# Patient Record
Sex: Male | Born: 1975 | Race: White | Hispanic: No | Marital: Single | Smoking: Current every day smoker
Health system: Southern US, Community
[De-identification: ages and names within clinical notes are randomized; demographics above are authoritative.]

## PROBLEM LIST (undated history)

## (undated) DIAGNOSIS — E7800 Pure hypercholesterolemia, unspecified: Secondary | ICD-10-CM

## (undated) DIAGNOSIS — Z8619 Personal history of other infectious and parasitic diseases: Secondary | ICD-10-CM

## (undated) DIAGNOSIS — R238 Other skin changes: Secondary | ICD-10-CM

## (undated) DIAGNOSIS — N4830 Priapism, unspecified: Secondary | ICD-10-CM

## (undated) DIAGNOSIS — F1910 Other psychoactive substance abuse, uncomplicated: Secondary | ICD-10-CM

## (undated) DIAGNOSIS — G4733 Obstructive sleep apnea (adult) (pediatric): Secondary | ICD-10-CM

## (undated) DIAGNOSIS — B20 Human immunodeficiency virus [HIV] disease: Secondary | ICD-10-CM

## (undated) DIAGNOSIS — Z8739 Personal history of other diseases of the musculoskeletal system and connective tissue: Secondary | ICD-10-CM

## (undated) DIAGNOSIS — R7401 Elevation of levels of liver transaminase levels: Secondary | ICD-10-CM

## (undated) DIAGNOSIS — E119 Type 2 diabetes mellitus without complications: Secondary | ICD-10-CM

## (undated) HISTORY — DX: Obstructive sleep apnea (adult) (pediatric): G47.33

## (undated) HISTORY — DX: Type 2 diabetes mellitus without complications: E11.9

## (undated) HISTORY — DX: Priapism, unspecified: N48.30

## (undated) HISTORY — DX: Pure hypercholesterolemia, unspecified: E78.00

## (undated) HISTORY — DX: Personal history of other infectious and parasitic diseases: Z86.19

## (undated) HISTORY — DX: Personal history of other diseases of the musculoskeletal system and connective tissue: Z87.39

## (undated) HISTORY — DX: Morbid (severe) obesity due to excess calories: E66.01

## (undated) HISTORY — DX: Human immunodeficiency virus (HIV) disease: B20

## (undated) HISTORY — DX: Elevation of levels of liver transaminase levels: R74.01

## (undated) HISTORY — DX: Other psychoactive substance abuse, uncomplicated: F19.10

## (undated) MED FILL — Medication: Fill #1 | Status: CN

## (undated) MED FILL — Medication: Fill #2 | Status: CN

---
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
## 2009-03-26 DIAGNOSIS — B20 Human immunodeficiency virus [HIV] disease: Secondary | ICD-10-CM

## 2009-03-26 DIAGNOSIS — Z21 Asymptomatic human immunodeficiency virus [HIV] infection status: Secondary | ICD-10-CM

## 2009-03-26 HISTORY — DX: Asymptomatic human immunodeficiency virus (hiv) infection status: Z21

## 2009-03-26 HISTORY — DX: Human immunodeficiency virus (HIV) disease: B20

## 2009-10-08 ENCOUNTER — Encounter: Payer: Self-pay | Admitting: Infectious Diseases

## 2009-10-19 ENCOUNTER — Ambulatory Visit: Payer: Self-pay | Admitting: Internal Medicine

## 2009-10-19 DIAGNOSIS — B20 Human immunodeficiency virus [HIV] disease: Secondary | ICD-10-CM

## 2009-10-19 LAB — CONVERTED CEMR LAB
ALT: 24 units/L (ref 0–53)
AST: 17 units/L (ref 0–37)
Calcium: 9.2 mg/dL (ref 8.4–10.5)
Chloride: 102 meq/L (ref 96–112)
Creatinine, Ser: 1.1 mg/dL (ref 0.40–1.50)
Eosinophils Absolute: 0.1 10*3/uL (ref 0.0–0.7)
GC Probe Amp, Urine: NEGATIVE
HCV Ab: NEGATIVE
HIV 1 RNA Quant: 494 copies/mL — ABNORMAL HIGH (ref <48)
HIV-1 RNA Quant, Log: 2.69 — ABNORMAL HIGH (ref <1.68)
Hep B Core Total Ab: NEGATIVE
Hepatitis B Surface Ag: NEGATIVE
Leukocytes, UA: NEGATIVE
Lymphocytes Relative: 35 % (ref 12–46)
Lymphs Abs: 2 10*3/uL (ref 0.7–4.0)
Neutrophils Relative %: 56 % (ref 43–77)
Nitrite: NEGATIVE
Platelets: 187 10*3/uL (ref 150–400)
Specific Gravity, Urine: 1.018 (ref 1.005–1.030)
Total CHOL/HDL Ratio: 5.6
WBC: 5.7 10*3/uL (ref 4.0–10.5)
pH: 7 (ref 5.0–8.0)

## 2009-11-05 ENCOUNTER — Encounter: Payer: Self-pay | Admitting: Internal Medicine

## 2009-11-09 ENCOUNTER — Ambulatory Visit: Payer: Self-pay | Admitting: Internal Medicine

## 2009-11-09 ENCOUNTER — Encounter (INDEPENDENT_AMBULATORY_CARE_PROVIDER_SITE_OTHER): Payer: Self-pay | Admitting: *Deleted

## 2009-11-11 ENCOUNTER — Encounter: Payer: Self-pay | Admitting: Internal Medicine

## 2010-01-19 ENCOUNTER — Encounter: Payer: Self-pay | Admitting: Internal Medicine

## 2010-01-19 ENCOUNTER — Ambulatory Visit: Payer: Self-pay | Admitting: Internal Medicine

## 2010-01-19 LAB — CONVERTED CEMR LAB
ALT: 31 units/L (ref 0–53)
AST: 19 units/L (ref 0–37)
Albumin: 4.2 g/dL (ref 3.5–5.2)
Alkaline Phosphatase: 76 units/L (ref 39–117)
BUN: 16 mg/dL (ref 6–23)
Basophils Absolute: 0 10*3/uL (ref 0.0–0.1)
Basophils Relative: 0 % (ref 0–1)
CO2: 24 meq/L (ref 19–32)
Calcium: 8.9 mg/dL (ref 8.4–10.5)
Chloride: 106 meq/L (ref 96–112)
Creatinine, Ser: 0.98 mg/dL (ref 0.40–1.50)
Eosinophils Absolute: 0.2 10*3/uL (ref 0.0–0.7)
Eosinophils Relative: 3 % (ref 0–5)
Glucose, Bld: 124 mg/dL — ABNORMAL HIGH (ref 70–99)
HCT: 46.8 % (ref 39.0–52.0)
HIV 1 RNA Quant: 631 copies/mL — ABNORMAL HIGH (ref <48)
HIV-1 RNA Quant, Log: 2.8 — ABNORMAL HIGH (ref <1.68)
Hemoglobin: 15.4 g/dL (ref 13.0–17.0)
Lymphocytes Relative: 32 % (ref 12–46)
Lymphs Abs: 1.5 10*3/uL (ref 0.7–4.0)
MCHC: 32.9 g/dL (ref 30.0–36.0)
MCV: 94.5 fL (ref 78.0–100.0)
Monocytes Absolute: 0.3 10*3/uL (ref 0.1–1.0)
Monocytes Relative: 5 % (ref 3–12)
Neutro Abs: 2.7 10*3/uL (ref 1.7–7.7)
Neutrophils Relative %: 59 % (ref 43–77)
Platelets: 172 10*3/uL (ref 150–400)
Potassium: 3.9 meq/L (ref 3.5–5.3)
RBC: 4.95 M/uL (ref 4.22–5.81)
RDW: 13.3 % (ref 11.5–15.5)
Sodium: 141 meq/L (ref 135–145)
Total Bilirubin: 0.7 mg/dL (ref 0.3–1.2)
Total Protein: 6.8 g/dL (ref 6.0–8.3)
WBC: 4.7 10*3/uL (ref 4.0–10.5)

## 2010-02-16 ENCOUNTER — Ambulatory Visit: Payer: Self-pay | Admitting: Infectious Disease

## 2010-02-16 DIAGNOSIS — R519 Headache, unspecified: Secondary | ICD-10-CM | POA: Insufficient documentation

## 2010-02-16 DIAGNOSIS — R51 Headache: Secondary | ICD-10-CM

## 2010-02-16 DIAGNOSIS — L259 Unspecified contact dermatitis, unspecified cause: Secondary | ICD-10-CM | POA: Insufficient documentation

## 2010-03-03 ENCOUNTER — Ambulatory Visit: Payer: Self-pay | Admitting: Internal Medicine

## 2010-03-03 LAB — CONVERTED CEMR LAB
AST: 19 units/L (ref 0–37)
Albumin: 4.1 g/dL (ref 3.5–5.2)
Alkaline Phosphatase: 76 units/L (ref 39–117)
BUN: 12 mg/dL (ref 6–23)
Bilirubin Urine: NEGATIVE
HDL: 36 mg/dL — ABNORMAL LOW (ref >39)
HIV 1 RNA Quant: 1910 copies/mL — ABNORMAL HIGH (ref <48)
Ketones, urine, test strip: NEGATIVE
LDL Cholesterol: 106 mg/dL — ABNORMAL HIGH (ref 0–99)
Nitrite: NEGATIVE
Potassium: 5.1 meq/L (ref 3.5–5.3)
Specific Gravity, Urine: >=1.03
Total Bilirubin: 0.5 mg/dL (ref 0.3–1.2)
Total CHOL/HDL Ratio: 4.6
Triglycerides: 108 mg/dL (ref <150)
VLDL: 22 mg/dL (ref 0–40)

## 2010-03-18 ENCOUNTER — Telehealth (INDEPENDENT_AMBULATORY_CARE_PROVIDER_SITE_OTHER): Payer: Self-pay | Admitting: *Deleted

## 2010-04-05 ENCOUNTER — Encounter: Payer: Self-pay | Admitting: Internal Medicine

## 2010-04-05 ENCOUNTER — Ambulatory Visit: Payer: Self-pay | Admitting: Internal Medicine

## 2010-04-05 LAB — CONVERTED CEMR LAB: CD4 Count: 619 microliters

## 2010-04-08 ENCOUNTER — Encounter: Payer: Self-pay | Admitting: Internal Medicine

## 2010-04-11 ENCOUNTER — Encounter: Payer: Self-pay | Admitting: Infectious Disease

## 2010-04-12 ENCOUNTER — Ambulatory Visit: Payer: Self-pay | Admitting: Internal Medicine

## 2010-05-03 ENCOUNTER — Telehealth: Payer: Self-pay | Admitting: Infectious Disease

## 2010-05-04 ENCOUNTER — Encounter: Payer: Self-pay | Admitting: Infectious Disease

## 2010-05-09 ENCOUNTER — Encounter: Payer: Self-pay | Admitting: Internal Medicine

## 2010-05-09 ENCOUNTER — Ambulatory Visit: Payer: Self-pay | Admitting: Internal Medicine

## 2010-05-09 LAB — CONVERTED CEMR LAB
BUN: 14 mg/dL (ref 6–23)
Blood in Urine, dipstick: NEGATIVE
CO2: 26 meq/L (ref 19–32)
Chloride: 106 meq/L (ref 96–112)
Creatinine, Ser: 1.03 mg/dL (ref 0.40–1.50)
Glucose, Bld: 102 mg/dL — ABNORMAL HIGH (ref 70–99)
HIV-1 RNA Quant, Log: <1.3 (ref <1.30)
Ketones, urine, test strip: NEGATIVE
Nitrite: NEGATIVE
Potassium: 4.6 meq/L (ref 3.5–5.3)
Protein, U semiquant: NEGATIVE
Urobilinogen, UA: 0.2
WBC Urine, dipstick: NEGATIVE

## 2010-05-23 ENCOUNTER — Ambulatory Visit: Payer: Self-pay | Admitting: Internal Medicine

## 2010-05-23 DIAGNOSIS — H6690 Otitis media, unspecified, unspecified ear: Secondary | ICD-10-CM | POA: Insufficient documentation

## 2010-06-14 ENCOUNTER — Telehealth: Payer: Self-pay | Admitting: Internal Medicine

## 2010-07-19 ENCOUNTER — Ambulatory Visit
Admission: RE | Admit: 2010-07-19 | Discharge: 2010-07-19 | Payer: Self-pay | Source: Home / Self Care | Attending: Internal Medicine | Admitting: Internal Medicine

## 2010-07-19 ENCOUNTER — Encounter: Payer: Self-pay | Admitting: Infectious Disease

## 2010-07-19 LAB — CONVERTED CEMR LAB
BUN: 10 mg/dL (ref 6–23)
Blood in Urine, dipstick: NEGATIVE
CD4 Count: 547 microliters
Chloride: 105 meq/L (ref 96–112)
Glucose, Bld: 101 mg/dL — ABNORMAL HIGH (ref 70–99)
Glucose, Urine, Semiquant: NEGATIVE
HIV 1 RNA Quant: <20 copies/mL (ref <20)
HIV-1 RNA Quant, Log: <1.3 (ref <1.30)
Nitrite: NEGATIVE
Potassium: 4.2 meq/L (ref 3.5–5.3)
Sodium: 140 meq/L (ref 135–145)
pH: 5.5

## 2010-07-26 NOTE — Assessment & Plan Note (Signed)
Summary: RESEARCH [MKJ]    Current Allergies: No known allergies  Vital Signs:  Patient profile:   35 year old male Height:      71.5 inches (181.61 cm) Weight:      247.75 pounds (112.61 kg) BMI:     34.20 Temp:     98.2 degrees F oral Pulse rate:   75 / minute Resp:     16 per minute BP sitting:   102 / 67  (left arm) Is Patient Diabetic? No Research Study Name: START Pain Assessment Patient in pain? no      Nutritional Status BMI of > 30 = obese  Does patient need assistance? Functional Status Self care Ambulation Normal   Patient here to screen for START study. Informed consent obtained per SOP guidelines. Patient had read over consent prior to visit. We discussed it and all questions were answered. He also consented to the neurology and genomics substudies. He denies any current complaints, except for a fading rash on his trunk that has responded to the triamcinolone cream. Neuro tests were performed at this visit. Peg grooved board results were Rt (dominant)hand - 77sec. and Lt hand - 74sec. Other results documented on worksheets. ECG will be done at the next CD4 blood draw visit. He plans to return 03/24/10 for the next screening visit. Deirdre Evener RN  March 03, 2010 1:36 PM   Other Orders: Est. Patient Research Study [id number]) T-Comprehensive Metabolic Panel [id number]) T-Lipid Profile [phone]) T-Urinalysis Dipstick only [phone]) T-HIV Viral Load [phone])  Laboratory Results   Urine Tests  Date/Time Recieved: 03/03/10 11am Date/Time Reported: 03/03/10 11am  Routine Urinalysis   Color: yellow Appearance: Clear Glucose: negative   (Normal Range: Negative) Bilirubin: negative   (Normal Range: Negative) Ketone: negative   (Normal Range: Negative) Spec. Gravity: >=1.030   (Normal Range: 1.003-1.035) Blood: negative   (Normal Range: Negative) pH: 5.5   (Normal Range: 5.0-8.0) Protein: negative   (Normal Range: Negative) Urobilinogen: 0.2   (Normal  Range: 0-1) Nitrite: negative   (Normal Range: Negative) Leukocyte Esterace: negative   (Normal Range: Negative)

## 2010-07-26 NOTE — Miscellaneous (Signed)
Summary: CD4 (RESEARCH)  Clinical Lists Changes  Observations: Added new observation of CD4 COUNT: 699 microliters (05/09/2010 14:15)

## 2010-07-26 NOTE — Miscellaneous (Signed)
Summary: CD4 (RESEARCH)  Clinical Lists Changes  Observations: Added new observation of CD4 COUNT: 619 microliters (04/05/2010 9:45)

## 2010-07-26 NOTE — Miscellaneous (Signed)
Summary: CD4 (RESEARCH)  Clinical Lists Changes  Observations: Added new observation of CD4 COUNT: 598 microliters (03/03/2010 9:44)

## 2010-07-26 NOTE — Miscellaneous (Signed)
  Clinical Lists Changes       PPD Results    Date of reading: 04/08/2010    Results: < 5mm    Interpretation: negative

## 2010-07-26 NOTE — Miscellaneous (Signed)
  Clinical Lists Changes  Medications: Removed medication of ATRIPLA 600-200-300 MG TABS (EFAVIRENZ-EMTRICITAB-TENOFOVIR) 1 by mouth daily on an empty stomach Added new medication of ATRIPLA 600-200-300 MG TABS (EFAVIRENZ-EMTRICITAB-TENOFOVIR) 1 by mouth daily on an empty stomach - Signed Rx of ATRIPLA 600-200-300 MG TABS (EFAVIRENZ-EMTRICITAB-TENOFOVIR) 1 by mouth daily on an empty stomach;  #90 x 0;  Signed;  Entered by: Deirdre Evener RN;  Authorized by: Paulette Blanch Dam MD;  Method used: Print then Give to Patient    Prescriptions: ATRIPLA 600-200-300 MG TABS (EFAVIRENZ-EMTRICITAB-TENOFOVIR) 1 by mouth daily on an empty stomach  #90 x 0   Entered by:   Deirdre Evener RN   Authorized by:   Acey Lav MD   Signed by:   Deirdre Evener RN on 05/04/2010   Method used:   Print then Give to Patient   RxID:   [id number]

## 2010-07-26 NOTE — Assessment & Plan Note (Signed)
Summary: f/u on labs KV pt/jc   Visit Type:  Follow-up Primary Provider:  Yisroel Ramming  CC:  follow-up visit.  History of Present Illness: 35 year old with HIV diagnosed in [place] with vL in 3k range, wildtype virus, who has had CD4 count consistently above 500 since then and actually relatively low viral load between 400 to 600 copies. He returns for followup visit. He has been doing well though he does have:  #1 headache behind sinuses and left eye that occursw in am at work and does respond to [employer] and plugging ears and rest.  #2 rash on his trunk that looks on exam like eczema that respondes to topical neosporin  I spent an hour with this patient including >50% face to face counselling. We went over DHHS first line therapies and new complera as well> We discussed START trial for which he would be a candidate and plusses and minuses of starting now vs waiting inside or outside of a trial. He expressed interest in START and I had Windell Moulding meet with him and will refer him to [facility].  Problems Prior to Update: 1)  HIV Infection  (ICD-042)  Medications Prior to Update: 1)  None  Current Medications (verified): 1)  Triamcinolone Acetonide 0.025 % Oint (Triamcinolone Acetonide) .... Apply Twice Daliy To Rash  Allergies (verified): No Known Drug Allergies   Preventive Screening-Counseling & Management  Alcohol-Tobacco     Alcohol drinks/day: weekends 7 drinks per day     Alcohol type: beer     Smoking Status: current     Packs/Day: 0.75     Year Started: 1990  Caffeine-Diet-Exercise     Caffeine use/day: tea, soda 6 per day     Does Patient Exercise: yes     Type of exercise: running     Times/week: <3  Safety-Violence-Falls     Seat Belt Use: yes   Current Allergies (reviewed today): No known allergies  Past History:  Past Medical History: HIV Eczema Headaches  Past Surgical History: None  Family History: nonconytributory  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  The patient denies anorexia, fever, weight loss, weight gain, vision loss, decreased hearing, hoarseness, chest pain, syncope, dyspnea on exertion, peripheral edema, prolonged cough, hemoptysis, abdominal pain, melena, hematochezia, severe indigestion/heartburn, hematuria, incontinence, genital sores, muscle weakness, transient blindness, difficulty walking, depression, unusual weight change, abnormal bleeding, enlarged lymph nodes, and angioedema.    Vital Signs:  Patient profile:   35 year old male Height:      71 inches (180.34 cm) Weight:      248.4 pounds (112.91 kg) BMI:     34.77 Temp:     98.2 degrees F oral Pulse rate:   91 / minute BP sitting:   109 / 73  (left arm)  Vitals Entered By: Baxter Hire) (February 16, 2010 3:15 PM) CC: follow-up visit Pain Assessment Patient in pain? no      Nutritional Status BMI of > 30 = obese Nutritional Status Detail appetite is healthy per patient  Have you ever been in a relationship where you felt threatened, hurt or afraid?No   Does patient need assistance? Functional Status Self care Ambulation Normal   Physical Exam  General:  alert, well-developed, well-nourished, and well-hydrated.   Head:  normocephalic and atraumatic.   Eyes:  pupils equal, pupils round, pupils  reactive to light, and pupils react to accomodation.   Ears:  no external deformities.   Nose:  no external erythema.   Mouth:  pharynx pink and moist, no erythema, and no exudates.   Neck:  supple and full ROM.   Lungs:  normal breath sounds.  no crackles and no wheezes.   Heart:  normal rate, regular rhythm, and no murmur.   Abdomen:  soft and no distention.   Msk:  normal ROM and no joint deformities.   Extremities:  No clubbing, cyanosis, edema, or deformity noted with normal full range of motion of all  joints.   Neurologic:  alert & oriented X3, strength normal in all extremities, and gait normal.   Skin:  he has ezcematous rash on trunk few cmin diameter Psych:  Oriented X3, memory intact for recent and remote, and normally interactive.      Impression & Recommendations:  Problem # 1:  HIV INFECTION (ICD-042) I think he could be an ideal candidate for START. He has relatively low viral load so if he is randomized to deferred therapy, my guess is his cd4 would take some time to drop below 350. If he is randomized to start he would quickly suppress his virus. In the end he is going to need therapy because he is not an elite controller. I wil fwd to Deirdre Evener Orders: Est. Patient Level V (99215)Future Orders: T-CD4SP ([facility]) (CD4SP) ... 05/17/2010 T-HIV Viral Load [id number]) ... 05/17/2010 T-CBC w/Diff ([id number]) ... 05/17/2010 T-Comprehensive Metabolic Panel [id number]) ... 05/17/2010  Problem # 2:  HEADACHE (ICD-784.0)  likely migraine variant. Offered prophylactic antidepressants which he did not want and recommended continued ssx management with otc meds for now  Orders: Est. Patient Level V (89211)  Problem # 3:  ECZEMA (ICD-692.9)  will give topical triamcinolone fo rthis His updated medication list for this problem includes:    Triamcinolone Acetonide 0.025 % Oint (Triamcinolone acetonide) .Marland Kitchen... Apply twice daliy to rash  Orders: Est. Patient Level V (94174)  Medications Added to Medication List This Visit: 1)  Triamcinolone Acetonide 0.025 % Oint (Triamcinolone acetonide) .... Apply twice daliy to rash  Other Orders: Influenza Vaccine NON MCR (08144)  Patient Instructions: 1)  Meet with Beverlee Nims re START 2)  Rtc in 3 months time 3)  Be sure to return for lab work one (1) week before your next appointment as scheduled.  Prescriptions: TRIAMCINOLONE ACETONIDE 0.025 % OINT (TRIAMCINOLONE ACETONIDE) apply twice daliy to rash  #30 x 2   Entered and  Authorized by:   Acey Lav MD   Signed by:   Paulette Blanch Dam MD on 02/16/2010   Method used:   Electronically to        [facility] (retail)       [address], [address]       [place], [place]  [address]       Ph: [phone]       Fax: [phone]   RxID:   [id number]    Immunizations Administered:  Influenza Vaccine # 1:    Vaccine Type: Fluvax Non-MCR    Site: left deltoid    Mfr: Novartis    Dose: 0.5 ml    Route: IM    Given by: Wendall Mola CMA ( AAMA)    Exp. Date: 09/25/2010    Lot #: [id number]    VIS given: 01/17/07 version given February 16, 2010.  Hepatitis A Vaccine # 1 (to be given today)  Flu Vaccine Consent Questions:    Do you have a history of severe allergic reactions to this vaccine? no    Any prior history of allergic reactions to egg and/or gelatin? no    Do you have a sensitivity to the preservative Thimersol? no    Do you have a past history of Guillan-Barre Syndrome? no    Do you currently have an acute febrile illness? no    Have you ever had a severe reaction to latex? no    Vaccine information given and explained to patient? yes

## 2010-07-26 NOTE — Miscellaneous (Signed)
  Clinical Lists Changes  Medications: Added new medication of ATRIPLA 600-200-300 MG TABS (EFAVIRENZ-EMTRICITAB-TENOFOVIR) 1 by mouth daily on an empty stomach - Signed Rx of ATRIPLA 600-200-300 MG TABS (EFAVIRENZ-EMTRICITAB-TENOFOVIR) 1 by mouth daily on an empty stomach;  #30 x 1;  Signed;  Entered by: Deirdre Evener RN;  Authorized by: Paulette Blanch Dam MD;  Method used: Print then Give to Patient  Patient was randomized today on the START study. He was assigned early treatment. I will get Dr. Daiva Eves to write him a prescription for Atripla and have him come in soon to pickup and see the pharmacist.Kim Epperson RN  April 11, 2010 4:38 PM    Prescriptions: ATRIPLA 600-200-300 MG TABS (EFAVIRENZ-EMTRICITAB-TENOFOVIR) 1 by mouth daily on an empty stomach  #30 x 1   Entered by:   Deirdre Evener RN   Authorized by:   Acey Lav MD   Signed by:   Deirdre Evener RN on 04/11/2010   Method used:   Print then Give to Patient   RxID:   [id number]

## 2010-07-26 NOTE — Assessment & Plan Note (Signed)
Summary: new 042 /   CC:  new patient, lab results, and swollen lymph nodes a few weeks ago x 3 days.  History of Present Illness: This is the first ID clinic visit for Timothy Perry.  He moved here in early February from [place]. He is staying with friends. He was diagnosed HIV (+) in Sept. of last year.  Original CD4ct was 702 and VL 3,570.  Genotype showed a niave virus.  His risk factor is MSM.  He currently does not have a partner. He had some swollend nodes a few weeks ago but they resolved.  He is otherwise asymptomatic.  Preventive Screening-Counseling & Management  Alcohol-Tobacco     Alcohol drinks/day: weekends 7 drinks per day     Alcohol type: beer     Smoking Status: current     Packs/Day: 0.5     Year Started: 1990  Caffeine-Diet-Exercise     Caffeine use/day: tea, soda 6 per day     Does Patient Exercise: yes     Type of exercise: running     Times/week: <3  Safety-Violence-Falls     Seat Belt Use: yes      Sexual History:  n/a.        Drug Use:  former, meth, and occasional marijuana.    Comments: pt declined condoms   Current Allergies (reviewed today): No known allergies  Social History: Sexual History:  n/a Drug Use:  former, meth, occasional marijuana  Review of Systems  The patient denies anorexia, fever, weight loss, dyspnea on exertion, prolonged cough, and hemoptysis.    Vital Signs:  Patient profile:   35 year old male Height:      71 inches (180.34 cm) Weight:      245.8 pounds (111.73 kg) BMI:     34.41 Temp:     98.0 degrees F (36.67 degrees C) oral Pulse rate:   76 / minute BP sitting:   114 / 76  (right arm)  Vitals Entered By: Wendall Mola CMA Duncan Dull) (Nov 09, 2009 2:19 PM) CC: new patient, lab results, swollen lymph nodes a few weeks ago x 3 days Is Patient Diabetic? No Pain Assessment Patient in pain? no      Nutritional Status BMI of > 30 = obese Nutritional Status Detail appetite "good"  Have you ever been in a  relationship where you felt threatened, hurt or afraid?No   Does patient need assistance? Functional Status Self care Ambulation Normal Comments pt. had a breakout of itchy rash 4 weeks ago, gone now   Physical Exam  General:  alert, well-developed, well-nourished, and well-hydrated.   Head:  normocephalic and atraumatic.   Eyes:  pupils equal, pupils round, pupils reactive to light, and pupils react to accomodation.   Mouth:  pharynx pink and moist.   Neck:  supple and no masses.   Lungs:  normal breath sounds.   Heart:  normal rate, regular rhythm, and no murmur.             Prevention For Positives: 11/09/2009   Safe sex practices discussed with patient. Condoms offered.                             Impression & Recommendations:  Problem # 1:  HIV INFECTION (ICD-042) Current CD4ct 660 and VL 494.  Discussed the pros and cons to starting treatment. He will hold off for now. We will  begin the Hep B vaccine  series today. Diagnostics Reviewed:  HIV: REACTIVE (10/19/2009)   HIV-Western blot: Positive (10/19/2009)   CD4: 660 (10/20/2009)   WBC: 5.7 (10/19/2009)   Hgb: 15.7 (10/19/2009)   HCT: 45.6 (10/19/2009)   Platelets: 187 (10/19/2009) HIV-1 RNA: 494 (10/19/2009)   HBSAg: NEG (10/19/2009)  Orders: New Patient Level III (99203)Future Orders: T-CD4SP ([facility]) (CD4SP) ... 02/07/2010 T-HIV Viral Load [phone]) ... 02/07/2010 T-Comprehensive Metabolic Panel [id number]) ... 02/07/2010 T-CBC w/Diff ([id number]) ... 02/07/2010  Other Orders: Hepatitis B Vaccine >7yrs ([phone]) Admin 1st Vaccine (28413) Admin 1st Vaccine [facility]) [phone])  Patient Instructions: 1)  Please schedule a follow-up appointment in 3 months, 2 weeks after labs.    Tetanus/Td Immunization History:    Tetanus/Td # 1:  Historical (06/28/2009)  Pneumovax Immunization History:    Pneumovax # 1:  Historical (06/28/2009)  Hepatitis B Vaccine # 1    Vaccine Type: HepB Adult    Site:  right deltoid    Mfr: Merck    Dose: 0.5 ml    Route: IM    Given by: Wendall Mola CMA ( AAMA)    Exp. Date: 09/22/2011    Lot #: [id number]    VIS given: 01/10/06 version given Nov 09, 2009.

## 2010-07-26 NOTE — Assessment & Plan Note (Signed)
Summary: STUDY APPT/ LH    Current Allergies: No known allergies  Patient came today and picked up his Atripla provided by the study. He was instructed on dosing, adherence, side effects and when to call us for problems. He plans to start the med tonight since he is off work all week and let me know in a few days how he is doing. Claretta Fraise the pharmacist is also reviewing the med side effects, dosing, adherence, etc with him today. He will return in 1 month for followup.Deirdre Evener RN  April 12, 2010 2:58 PM    Other Orders: Est. Patient Research Study [phone])

## 2010-07-26 NOTE — Assessment & Plan Note (Signed)
Summary: STUDY APPT/ LH    Current Allergies: No known allergies  Vital Signs:  Patient profile:   35 year old male Weight:      252.5 pounds (114.77 kg) Temp:     98.0 degrees F oral Pulse rate:   84 / minute Resp:     15 per minute  Serial Vital Signs/Assessments:  Time      Position  BP       Pulse  Resp  Temp     By           R Arm     120/81                         Deirdre Evener RN           L Arm     116/78                         Deirdre Evener RN   Patient came today for second screening visit for START. Another CD4 was drawn and a 12 lead EKG obtained. We discussed the oral health study as an option if he gets randomized to meds. He denies any new problems or concerns. He would like to consider Atripla if he needs to take any meds and if Dr. Philipp Deputy agrees. EKG:  E point 19.0, V-6  16.0 as measured with the heart square.Deirdre Evener RN  April 05, 2010 1:44 PM

## 2010-07-26 NOTE — Consult Note (Signed)
Summary: New Pt. Referral: [facility]  New Pt. Referral: [facility]   Imported By: Florinda Marker 11/11/2009 10:14:37  _____________________________________________________________________  External Attachment:    Type:   Image     Comment:   External Document

## 2010-07-26 NOTE — Assessment & Plan Note (Signed)
Summary: Nurse Visit (Infectious Disease), Hep B #2   Current Allergies: No known allergies  Immunizations Administered:  Hepatitis B Vaccine # 2:    Vaccine Type: HepB Adult    Site: left deltoid    Mfr: Merck    Dose: 0.5 ml    Route: IM    Given by: Jennet Maduro RN    Exp. Date: 10/24/2011    Lot #: [id number]  Orders Added: 1)  Hepatitis B Vaccine >55yrs [90746] 2)  Admin 1st Vaccine [04540]

## 2010-07-26 NOTE — Miscellaneous (Signed)
Summary: New 042 labs   Clinical Lists Changes  Problems: Added new problem of HIV INFECTION (ICD-042) Orders: Added new Test order of T-Comprehensive Metabolic Panel [phone]) - Signed Added new Test order of T-CBC w/Diff [phone]) - Signed Added new Test order of T-CD4SP [facility] [address]) (CD4SP) - Signed Added new Test order of T-Chlamydia  Probe, urine [phone]) - Signed Added new Test order of T-GC Probe, urine [phone]) - Signed Added new Test order of T-Hepatitis B Surface Antigen ([phone]) - Signed Added new Test order of T-Hepatitis B Surface Antibody [phone]) - Signed Added new Test order of T-Hepatitis B Core Antibody ([id number]) - Signed Added new Test order of T-Hepatitis C Antibody ([id number]) - Signed Added new Test order of T-HIV Antibody  (Reflex) [phone]) - Signed Added new Test order of T-HIV Ab Confirmatory Test/Western Blot ([id number]) - Signed Added new Test order of T-Lipid Profile ([id number]) - Signed Added new Test order of T-RPR (Syphilis) ([id number]) - Signed Added new Test order of T-Urinalysis ([id number]) - Signed Added new Test order of T-HIV1 Quant rflx Ultra or Genotype ([id number]) - Signed Added new Test order of T-Hepatitis A Antibody ([id number]) - Signed

## 2010-07-26 NOTE — Assessment & Plan Note (Signed)
    Hepatitis A Vaccine # 1    Vaccine Type: HepA    Site: left deltoid    Mfr: GlaxoSmithKline    Dose: 1.0 ml    Route: IM    Given by: Deirdre Evener RN    Exp. Date: 06/08/2012    Lot #: [id number]    VIS given: 09/13/04 version given May 09, 2010.

## 2010-07-26 NOTE — Miscellaneous (Signed)
Summary: clinical update/ryan white  Clinical Lists Changes  Observations: Added new observation of INFECTDIS MD: Philipp Deputy (11/09/2009 16:16) Added new observation of RWTITLE: B (11/09/2009 16:16) Added new observation of PAYOR: No Insurance (11/09/2009 16:16) Added new observation of AIDSDAP: No (11/09/2009 16:16) Added new observation of PCTFPL: 124.65  (11/09/2009 16:16) Added new observation of INCOMESOURCE: Wages  (11/09/2009 16:16) Added new observation of HOUSEINCOME: 16109  (11/09/2009 16:16) Added new observation of #CHILD<18 IN: No  (11/09/2009 16:16) Added new observation of FAMILYSIZE: 1  (11/09/2009 16:16) Added new observation of HOUSING: Stable/permanent  (11/09/2009 16:16) Added new observation of FINASSESSDT: 11/09/2009  (11/09/2009 16:16) Added new observation of YEARLYEXPEN: 0  (11/09/2009 16:16) Added new observation of HIV RISK BEH: MSM  (11/09/2009 16:16) Added new observation of HIV INIT DX: 02/25/2009  (11/09/2009 16:16) Added new observation of GENDER: Male  (11/09/2009 16:16) Added new observation of MARITAL STAT: Single  (11/09/2009 16:16) Added new observation of LATINO/HISP: No  (11/09/2009 16:16) Added new observation of RACE: White  (11/09/2009 16:16) Added new observation of REC_MESSAGE: Yes  (11/09/2009 16:16) Added new observation of RECPHONECALL: Yes  (11/09/2009 16:16) Added new observation of REC_MAIL: Yes  (11/09/2009 16:16) Added new observation of RW VITAL STA: Active  (11/09/2009 16:16) Added new observation of PATNTCOUNTY: [place]  (11/09/2009 16:16) Added new observation of RWPARTICIP: Yes  (11/09/2009 16:16)

## 2010-07-26 NOTE — Progress Notes (Signed)
  Phone Note Call from Patient Call back at Home Phone [phone]   Reason for Call: Talk to Nurse Summary of Call: Tod called and said he has been having abd pain that wraps around his waist above the belly button after he takes his atripla at night. It would go away eventually but today he was still having some pain after last night.(5) It has been going on for 1 and 1/2 weeks now. He denies any nausea, fever, or bowel upset. He is out of town presently and has been on the road alot. He is to come in for a study visit next Monday. I told him to hold his atripla for 3 days to see if he stopped having the pain and if it got worse, to see someone urgently. He will call back later in the week.Deirdre Evener RN  May 03, 2010 12:51 PM Initial call taken by: Deirdre Evener RN,  May 03, 2010 12:51 PM     Appended Document:  Seems like odd side effect. Let me hear what happens here

## 2010-07-26 NOTE — Assessment & Plan Note (Signed)
Summary: STUDY APPT/ LH     Current Allergies: No known allergies  Vital Signs:  Patient profile:   35 year old male Weight:      258.75 pounds (117.61 kg) Temp:     98.6 degrees F oral Pulse rate:   79 / minute Resp:     16 per minute  Serial Vital Signs/Assessments:  Time      Position  BP       Pulse  Resp  Temp     By           R Arm     117/83                         Deirdre Evener RN           L Arm     114/80                         Deirdre Evener RN   Patient here for 1 month START study visit. He says that his abd pain has gone away, which he now believes was some severe gas pains. He is not having any problems with it now and he never stopped his meds.  He is having some weird dreams , but they are not bothersome.  He currently has a rash midback about a quarter size that looks suspiciously like "herpes or shingles". (crusty over pimply rash) He does say that it recurs from time to time in the same spot, starts out suddenly with itchy pain and subsides. It is not bothering him enough for treatment. He is to return in 2 weeks to see Dr. Philipp Deputy.Deirdre Evener RN  May 09, 2010 10:35 AM    Other Orders: Est. Patient Research Study [id number]) T-HIV Viral Load [id number]) T-Basic Metabolic Panel [id number]) T-Urinalysis Dipstick only [id number])  Laboratory Results   Urine Tests  Date/Time Recieved: 05/09/10 10am Date/Time Reported: 05/09/10 10:45  Routine Urinalysis   Color: yellow Appearance: Clear Glucose: negative   (Normal Range: Negative) Bilirubin: negative   (Normal Range: Negative) Ketone: negative   (Normal Range: Negative) Spec. Gravity: >=1.030   (Normal Range: 1.003-1.035) Blood: negative   (Normal Range: Negative) pH: 6.0   (Normal Range: 5.0-8.0) Protein: negative   (Normal Range: Negative) Urobilinogen: 0.2   (Normal Range: 0-1) Nitrite: negative   (Normal Range: Negative) Leukocyte Esterace: negative   (Normal Range: Negative)         Laboratory Results   Urine Tests    Routine Urinalysis   Color: yellow Appearance: Clear Glucose: negative   (Normal Range: Negative) Bilirubin: negative   (Normal Range: Negative) Ketone: negative   (Normal Range: Negative) Spec. Gravity: >=1.030   (Normal Range: 1.003-1.035) Blood: negative   (Normal Range: Negative) pH: 6.0   (Normal Range: 5.0-8.0) Protein: negative   (Normal Range: Negative) Urobilinogen: 0.2   (Normal Range: 0-1) Nitrite: negative   (Normal Range: Negative) Leukocyte Esterace: negative   (Normal Range: Negative)

## 2010-07-26 NOTE — Assessment & Plan Note (Signed)
    PPD Application    Vaccine Type: PPD    Site: left forearm    Mfr: Sanofi Pasteur    Dose: 0.1 ml    Route: ID    Given by: Deirdre Evener RN    Exp. Date: 12/31/2011    Lot #: [id number] Patient instructed to call back in 2-3 days if negative, he will return for inspection if site is raised/red.Deirdre Evener RN  April 05, 2010 12:11 PM

## 2010-07-26 NOTE — Progress Notes (Signed)
Summary: PPD  Phone Note Outgoing Call   Call placed by: Annice Pih Summary of Call: Pt. needs PPD at next office visit Initial call taken by: Wendall Mola CMA Duncan Dull),  March 18, 2010 12:42 PM

## 2010-07-26 NOTE — Letter (Signed)
Summary: [facility]: [facility]  [facility]   Imported By: Florinda Marker 11/11/2009 11:45:20  _____________________________________________________________________  External Attachment:    Type:   Image     Comment:   External Document

## 2010-07-26 NOTE — Consult Note (Signed)
Summary: Referral: [facility]  Referral: [facility]   Imported By: Florinda Marker 11/25/2009 15:15:59  _____________________________________________________________________  External Attachment:    Type:   Image     Comment:   External Document

## 2010-07-26 NOTE — Assessment & Plan Note (Signed)
Summary: F/U/VS   Primary Provider:  Yisroel Ramming  CC:  follow-up visit and lab results.  History of Present Illness:   Patient complains of a left ear pain.  He's had pain for several days now.  No fever or chills.  Preventive Screening-Counseling & Management  Alcohol-Tobacco     Alcohol drinks/day: weekends 7 drinks per day     Alcohol type: beer     Smoking Status: current     Packs/Day: 0.75     Year Started: 1990  Caffeine-Diet-Exercise     Caffeine use/day: tea, soda 6 per day     Does Patient Exercise: yes     Type of exercise: running     Times/week: <3  Safety-Violence-Falls     Seat Belt Use: yes      Sexual History:  n/a.        Drug Use:  former, meth, and occasional marijuana.    Comments: pt. declined condoms   Updated Prior Medication List: TRIAMCINOLONE ACETONIDE 0.025 % OINT (TRIAMCINOLONE ACETONIDE) apply twice daliy to rash ATRIPLA 600-200-300 MG TABS (EFAVIRENZ-EMTRICITAB-TENOFOVIR) 1 by mouth daily on an empty stomach AMOXICILLIN 500 MG CAPS (AMOXICILLIN) Take 1 tablet by mouth three times a day  Current Allergies (reviewed today): No known allergies  Past History:  Past Medical History: Last updated: 02/16/2010 HIV Eczema Headaches  Review of Systems  The patient denies fever, decreased hearing, dyspnea on exertion, and prolonged cough.    Vital Signs:  Patient profile:   35 year old male Height:      71.5 inches (181.61 cm) Weight:      257.8 pounds (117.18 kg) BMI:     35.58 Temp:     98.0 degrees F (36.67 degrees C) oral Pulse rate:   89 / minute BP sitting:   118 / 78  (right arm)  Vitals Entered By: Wendall Mola CMA Duncan Dull) (May 23, 2010 9:21 AM) CC: follow-up visit, lab results Is Patient Diabetic? No Pain Assessment Patient in pain? no      Nutritional Status BMI of > 30 = obese Nutritional Status Detail appetite "good"  Have you ever been in a relationship where you felt threatened, hurt or  afraid?No   Does patient need assistance? Functional Status Self care Ambulation Normal Comments pt. missed one dose of Atripla since last visit   Physical Exam  General:  alert, well-developed, well-nourished, and well-hydrated.   Head:  normocephalic and atraumatic.   Ears:  left ear has cerumen impaction.  Unable to see the tympanic membrane. Mouth:  pharynx pink and moist.   Lungs:  normal breath sounds.     Impression & Recommendations:  Problem # 1:  OTITIS MEDIA (ICD-382.9) treat with amoxacillin His updated medication list for this problem includes:    Amoxicillin 500 Mg Caps (Amoxicillin) .Marland Kitchen... Take 1 tablet by mouth three times a day  Problem # 2:  HIV INFECTION (ICD-042) continue current meds. Orders: Est. Patient Level III (27253)  Diagnostics Reviewed:  HIV: HIV positive - not AIDS (11/09/2009)   HIV-Western blot: Positive (10/19/2009)   CD4: 619 (04/05/2010)   WBC: 4.7 (01/19/2010)   Hgb: 15.4 (01/19/2010)   HCT: 46.8 (01/19/2010)   Platelets: 172 (01/19/2010) HIV-1 RNA: <20 copies/mL (05/09/2010)   HBSAg: NEG (10/19/2009)  Medications Added to Medication List This Visit: 1)  Amoxicillin 500 Mg Caps (Amoxicillin) .... Take 1 tablet by mouth three times a day  Other Orders: Hepatitis B Vaccine >58yrs (66440) Admin 1st Vaccine (  16109) Prescriptions: AMOXICILLIN 500 MG CAPS (AMOXICILLIN) Take 1 tablet by mouth three times a day  #30 x 0   Entered and Authorized by:   Yisroel Ramming MD   Signed by:   Yisroel Ramming MD on 05/23/2010   Method used:   Print then Give to Patient   RxID:   [id number]      Immunizations Administered:  Hepatitis B Vaccine # 3:    Vaccine Type: HepB Adult    Site: left deltoid    Mfr: Merck    Dose: 0.5 ml    Route: IM    Given by: Wendall Mola CMA ( AAMA)    Exp. Date: 06/13/2012    Lot #: [id number]    VIS given: 01/10/06 version given May 23, 2010.  PPD Results    Date of reading: 04/07/2010     Results: < 5mm    Interpretation: negative

## 2010-07-26 NOTE — Miscellaneous (Signed)
Summary: CD4 (RESEARCH)  Clinical Lists Changes 

## 2010-07-28 NOTE — Assessment & Plan Note (Signed)
Summary: STUDY APPT/ LH    Current Allergies: No known allergies  Vital Signs:  Patient profile:   35 year old male Weight:      257.5 pounds (117.05 kg) BMI:     35.54 Temp:     98.5 degrees F oral Pulse rate:   87 / minute Resp:     14 per minute  Is Patient Diabetic? No Pain Assessment Patient in pain? no      Nutritional Status BMI of > 30 = obese  Does patient need  assistance? Functional Status Self care Ambulation Normal   Serial Vital Signs/Assessments:  Time      Position  BP       Pulse  Resp  Temp     By           R Arm     127/84                         Deirdre Evener RN           L Arm     122/84                         Deirdre Evener RN   Patient here for START study 4 month visit. He has a new rash on his upper RT arm that he says comes and goes. The rash on his trunk and mid back have cleared up. The abd pain he was having back in December had resolved on it's own without treatment. He is still having weird dreams that he relates to the medication. Denies any problems with adherence. He will return in May for his next study appt.Deirdre Evener RN  July 19, 2010 2:45 PM    Other Orders: Est. Patient Research Study [id number]) T-Urinalysis Dipstick only [id number]) T-Basic Metabolic Panel [id number]) T-HIV Viral Load [id number])  Laboratory Results   Urine Tests  Date/Time Received: Mariea Clonts  July 19, 2010 2:34 PM  Date/Time Reported: Mariea Clonts  July 19, 2010 2:34 PM   Routine Urinalysis   Color: yellow Appearance: Cloudy Glucose: negative   (Normal Range: Negative) Bilirubin: small   (Normal Range: Negative) Ketone: trace (5)   (Normal Range: Negative) Spec. Gravity: 1.025   (Normal Range: 1.003-1.035) Blood: negative   (Normal Range: Negative) pH: 5.5   (Normal Range: 5.0-8.0) Protein: negative   (Normal Range: Negative) Urobilinogen: 0.2   (Normal Range: 0-1) Nitrite: negative   (Normal Range: Negative) Leukocyte Esterace: negative   (Normal Range: Negative)

## 2010-07-28 NOTE — Progress Notes (Signed)
  Phone Note Call from Patient Call back at Home Phone [phone]   Caller: Patient Reason for Call: Talk to Nurse Summary of Call: Patient called and said he had been having abd. pain since Sunday that he thinks may be a kidney stone. He does not have insurance or a primary physician and wanted to know what he should do. He said Sunday the pain was making him double over and today is more of an aggravation. It started in his mid upper back and has moved around the sides and further down. He is also having mild nausea, but denies fever, vomiting, diarrhea. I instructed him to go to the ED or urgent Care for evaluation, which he said he would probably wait until the am to do.  I told him he should not wait if it got worse, had fever or vomiting. Initial call taken by: Deirdre Evener RN,  June 14, 2010 3:04 PM

## 2010-07-29 NOTE — Miscellaneous (Signed)
Summary: HIPAA Restrictions  HIPAA Restrictions   Imported By: Florinda Marker 10/19/2009 16:31:34  _____________________________________________________________________  External Attachment:    Type:   Image     Comment:   External Document

## 2010-08-03 NOTE — Miscellaneous (Signed)
Summary: HIV-1 RNA, CD4 (RESEARCH)  Clinical Lists Changes  Observations: Added new observation of CD4 COUNT: 547 microliters (07/19/2010 14:09) Added new observation of HIV1RNA QA: 19 copies/mL (07/19/2010 14:09)

## 2010-08-11 ENCOUNTER — Other Ambulatory Visit: Payer: Self-pay | Admitting: Adult Health

## 2010-08-12 ENCOUNTER — Ambulatory Visit: Payer: Self-pay

## 2010-09-10 LAB — T-HELPER CELL (CD4) - (RCID CLINIC ONLY)
CD4 % Helper T Cell: 36 % (ref 33–55)
CD4 T Cell Abs: 540 uL (ref 400–2700)

## 2010-09-13 LAB — T-HELPER CELL (CD4) - (RCID CLINIC ONLY): CD4 % Helper T Cell: 34 % (ref 33–55)

## 2010-10-19 ENCOUNTER — Other Ambulatory Visit: Payer: Self-pay | Admitting: *Deleted

## 2010-10-19 DIAGNOSIS — B20 Human immunodeficiency virus [HIV] disease: Secondary | ICD-10-CM

## 2010-10-19 MED ORDER — EFAVIRENZ-EMTRICITAB-TENOFOVIR 600-200-300 MG PO TABS: 1.0000 | ORAL_TABLET | Freq: Every day | ORAL | Status: AC

## 2010-11-23 ENCOUNTER — Ambulatory Visit (INDEPENDENT_AMBULATORY_CARE_PROVIDER_SITE_OTHER): Payer: Self-pay | Admitting: *Deleted

## 2010-11-23 VITALS — BP 112/79 | HR 85 | Temp 98.4°F | Resp 16 | Wt 239.0 lb

## 2010-11-23 DIAGNOSIS — Z21 Asymptomatic human immunodeficiency virus [HIV] infection status: Secondary | ICD-10-CM

## 2010-11-23 DIAGNOSIS — Z23 Encounter for immunization: Secondary | ICD-10-CM

## 2010-11-23 DIAGNOSIS — B20 Human immunodeficiency virus [HIV] disease: Secondary | ICD-10-CM

## 2010-11-23 LAB — POCT URINALYSIS DIPSTICK
Blood, UA: NEGATIVE
Protein, UA: NEGATIVE
Spec Grav, UA: 1.025
Urobilinogen, UA: 0.2

## 2010-11-23 NOTE — Progress Notes (Signed)
Patient here for his 8 month START study visit. He denies any new problems except for an increase in nightmares. He says that they are not bad enough to make him want to switch off his Timothy Perry and thinks they may attributed to when he takes his meds. He will return in October for his next visit.

## 2010-11-24 LAB — BASIC METABOLIC PANEL
BUN: 14 mg/dL (ref 6–23)
CO2: 26 mEq/L (ref 19–32)
Chloride: 104 mEq/L (ref 96–112)
Creat: 0.99 mg/dL (ref 0.40–1.50)

## 2010-12-07 ENCOUNTER — Encounter: Payer: Self-pay | Admitting: Adult Health

## 2010-12-07 LAB — CD4/CD8 (T-HELPER/T-SUPPRESSOR CELL): CD4%: 42.7

## 2011-04-05 ENCOUNTER — Encounter: Payer: Self-pay | Admitting: *Deleted

## 2011-04-05 ENCOUNTER — Ambulatory Visit (INDEPENDENT_AMBULATORY_CARE_PROVIDER_SITE_OTHER): Payer: Self-pay | Admitting: *Deleted

## 2011-04-05 VITALS — BP 110/70 | HR 72 | Temp 98.3°F | Resp 18 | Wt 236.2 lb

## 2011-04-05 DIAGNOSIS — Z23 Encounter for immunization: Secondary | ICD-10-CM

## 2011-04-05 DIAGNOSIS — Z21 Asymptomatic human immunodeficiency virus [HIV] infection status: Secondary | ICD-10-CM

## 2011-04-05 DIAGNOSIS — B20 Human immunodeficiency virus [HIV] disease: Secondary | ICD-10-CM

## 2011-04-05 DIAGNOSIS — Z0000 Encounter for general adult medical examination without abnormal findings: Secondary | ICD-10-CM

## 2011-04-05 LAB — LIPID PANEL
LDL Cholesterol: 119 mg/dL — ABNORMAL HIGH (ref 0–99)
Triglycerides: 112 mg/dL (ref <150)

## 2011-04-05 LAB — COMPREHENSIVE METABOLIC PANEL
ALT: 50 U/L (ref 0–53)
Albumin: 4.5 g/dL (ref 3.5–5.2)
CO2: 26 mEq/L (ref 19–32)
Calcium: 9.5 mg/dL (ref 8.4–10.5)
Chloride: 106 mEq/L (ref 96–112)
Creat: 0.9 mg/dL (ref 0.50–1.35)
Potassium: 4.9 mEq/L (ref 3.5–5.3)
Total Protein: 7 g/dL (ref 6.0–8.3)

## 2011-04-05 LAB — POCT URINALYSIS DIPSTICK
Bilirubin, UA: NEGATIVE
Leukocytes, UA: NEGATIVE
Nitrite, UA: NEGATIVE
Protein, UA: NEGATIVE

## 2011-04-05 NOTE — Progress Notes (Signed)
Patient is here for his 12 month START study visit. He denies any current problems or complaints. He is still having weird dreams and an occassional nightmare, but doesn't think he needs to change his medicine. He has been trying to diet and has lost a little weight. He has also changed jobs recently and feels a lot better about that. He was encouraged to get his Timothy Perry updated so he could see a provider soon.

## 2011-04-06 LAB — HIV-1 RNA QUANT-NO REFLEX-BLD: HIV 1 RNA Quant: <20 copies/mL (ref <20)

## 2011-04-19 ENCOUNTER — Encounter: Payer: Self-pay | Admitting: *Deleted

## 2011-04-19 ENCOUNTER — Ambulatory Visit: Payer: Self-pay

## 2011-04-19 NOTE — Progress Notes (Signed)
Tod spoke to me today about problems he has with voiding incontinently at night. He said it started about 2 weekss ago and has happened several times. He wakes up and the lower half of his bed is soaked. At first he thought he was having night sweats, but thinks he is leaking urine. Denies any burning, urgency,etc. No new meds. He is concerned about this becoming a chronic problems. I scheduled him to see Nida Boatman in about a month but if his problem worsens he will call back to see someone else sooner.

## 2011-04-20 ENCOUNTER — Encounter: Payer: Self-pay | Admitting: Adult Health

## 2011-04-20 LAB — CD4/CD8 (T-HELPER/T-SUPPRESSOR CELL): CD4: 606

## 2011-04-24 ENCOUNTER — Telehealth: Payer: Self-pay | Admitting: *Deleted

## 2011-05-09 ENCOUNTER — Ambulatory Visit (INDEPENDENT_AMBULATORY_CARE_PROVIDER_SITE_OTHER): Payer: Self-pay | Admitting: Internal Medicine

## 2011-05-09 ENCOUNTER — Encounter: Payer: Self-pay | Admitting: Internal Medicine

## 2011-05-09 DIAGNOSIS — F980 Enuresis not due to a substance or known physiological condition: Secondary | ICD-10-CM | POA: Insufficient documentation

## 2011-05-09 DIAGNOSIS — M545 Low back pain: Secondary | ICD-10-CM

## 2011-05-09 DIAGNOSIS — R61 Generalized hyperhidrosis: Secondary | ICD-10-CM

## 2011-05-09 DIAGNOSIS — B20 Human immunodeficiency virus [HIV] disease: Secondary | ICD-10-CM

## 2011-05-09 MED ORDER — CYCLOBENZAPRINE HCL 5 MG PO TABS: 5.0000 mg | ORAL_TABLET | Freq: Three times a day (TID) | ORAL | Status: DC | PRN

## 2011-05-09 NOTE — Assessment & Plan Note (Signed)
Suspect what he is noticing our night sweats. It certainly does not sound like he has enuresis. I asked him to take his temperature in the time he feels warm or feverish. I will also asked him to keep a Journal to track of what he notices on nights when he feels sweaty or damp to see if we can uncover any clues as to what is causing this.

## 2011-05-09 NOTE — Progress Notes (Signed)
  Subjective:    Patient ID: Timothy Perry, male    DOB: 07-21-1975, 35 y.o.   MRN: [id number]  HPI Timothy Perry is seen on a work in basis. For the last several years he has had intermittent episodes where he will wake up in the middle of the night feeling damp. A few months ago this began to happen more frequently. It had been happening maybe 2-3 times a month but recently has been happening 2-3 times a week. He initially thought that it was probably due to the fact that he sleeps with his 3 dogs but this has not changed recently. He then became concerned that he might have been incontinent of urine but in retrospect he does not note that his underwear is wet and has been does not have any odor of urine. He is often felt kind of warm in the evening but he has not taken his temperature. He has not had any change in his appetite or weight.  Also, one week ago he was helping a coworker move some furniture when the coworker dropped his end causing him to wrench his lower back. He had immediate back pain initially in the lower left but then spreading to his right side as well. He is also had some pain in his right thigh when climbing stairs. Right after his accident to the back pain was rated about 8/10. Since then he has been taking 3-4 Aleve daily and his pain has been a 5-6/10. He has not noticed any weakness or numbness in his legs. 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  He does not recall missing any of his Atripla since his last visit.    Review of Systems  Constitutional: Negative for chills, activity change, appetite change, fatigue and unexpected weight change.  Genitourinary: Negative for dysuria, frequency, flank pain, discharge and difficulty urinating.  Musculoskeletal: Positive for back pain.       Objective:   Physical Exam  Constitutional: No distress.  HENT:  Mouth/Throat: Oropharynx is clear and moist. No oropharyngeal exudate.    Cardiovascular: Normal rate, regular rhythm and normal heart sounds.   No murmur heard. Pulmonary/Chest: Breath sounds normal. He has no wheezes. He has no rales.  Musculoskeletal:       He has tenderness to palpation over his lower back worse on the right than on the left. He has a little bit of difficulty getting himself up out of the chair. He has no weakness in his lower extremities.  Psychiatric: He has a normal mood and affect.   HIV 1 RNA Quant (copies/mL)  Date Value  04/05/2011 <20   11/23/2010 <20   07/19/2010 <20 copies/mL      CD4 T Cell Abs (cmm)  Date Value  01/19/2010 540   10/19/2009 660             Assessment & Plan:

## 2011-05-09 NOTE — Assessment & Plan Note (Signed)
His infection remains under excellent control. I will have him continue Atripla.

## 2011-05-09 NOTE — Assessment & Plan Note (Signed)
I suspect he has musculoskeletal low back strain I will improve with time and stretching. I did offer to give him some muscle relaxer to take at bedtime.

## 2011-05-16 ENCOUNTER — Ambulatory Visit: Payer: Self-pay | Admitting: Adult Health

## 2011-05-30 ENCOUNTER — Ambulatory Visit (INDEPENDENT_AMBULATORY_CARE_PROVIDER_SITE_OTHER): Payer: Self-pay | Admitting: Internal Medicine

## 2011-05-30 ENCOUNTER — Encounter: Payer: Self-pay | Admitting: Internal Medicine

## 2011-05-30 DIAGNOSIS — B20 Human immunodeficiency virus [HIV] disease: Secondary | ICD-10-CM

## 2011-05-30 DIAGNOSIS — R32 Unspecified urinary incontinence: Secondary | ICD-10-CM

## 2011-05-30 DIAGNOSIS — F980 Enuresis not due to a substance or known physiological condition: Secondary | ICD-10-CM

## 2011-05-30 DIAGNOSIS — M545 Low back pain: Secondary | ICD-10-CM

## 2011-05-30 NOTE — Assessment & Plan Note (Signed)
I encouraged him to continue his stretching exercises.

## 2011-05-30 NOTE — Progress Notes (Signed)
  Subjective:    Patient ID: Timothy Perry, male    DOB: Oct 28, 1975, 35 y.o.   MRN: [id number]  HPI Timothy Perry is in for his routine followup visit. His low back pain continues to improve. He worked a 10 hour shift yesterday and had some stiffness and discomfort that was about a 2-3/10. He took some Flexeril and feels much better this morning. He is doing stretching exercises on a daily basis.  He recalls missing only one dose of Atripla since his last visit. He uses his pillbox and noticed that he had left his Wednesday evening dose in the pillbox.  Since his last visit several weeks ago he has been keeping a journal and now believes that he is having intermittent episodes of bedwetting. He states that this has been going on for several years but had only been happening a few times each year. Since his last visit he's had 4 separate episodes. He no longer believes that these are due to night sweats. He has taken his temperature has not had any fever. Each time he has noted that his underwear it is damp. This problem began to get more frequently than before he started taking the Flexeril. He will occasionally take Tylenol PM to help him sleep but does not know any association between that and this problem. He has always been told that he snores but seems to sleep well and has not had any change in his sleep pattern. This problem does not seem to be temporally associated with starting the Atripla last year. He does not have any dysuria or daytime incontinence. He usually takes a small drink of juice with his Atripla just before bedtime he usually does not have excessive fluid intake in the hour before going to bed. He does not ever have to get up at night to the bathroom.    Review of Systems     Objective:   Physical Exam  Constitutional: No distress.  HENT:  Mouth/Throat: Oropharynx is clear and moist. No oropharyngeal exudate.  Cardiovascular: Normal rate, regular rhythm and normal heart sounds.     No murmur heard. Pulmonary/Chest: Breath sounds normal. He has no wheezes. He has no rales.  Genitourinary: Penis normal.  Psychiatric: He has a normal mood and affect.   HIV 1 RNA Quant (copies/mL)  Date Value  04/05/2011 <20   11/23/2010 <20   07/19/2010 <20 copies/mL      CD4 T Cell Abs (cmm)  Date Value  01/19/2010 540   10/19/2009 660             Assessment & Plan:

## 2011-05-30 NOTE — Assessment & Plan Note (Signed)
I am not absolutely sure that what he is experiencing is truly enuresis. This is a quite uncommon problem in an otherwise healthy adult male. I cannot find any particular sleep disturbance or other problems that would contribute. I suggested that he limit fluid intake in the hour before bedtime and always try avoid before going to bed. We will see him back early next year after his medical insurance is reinstated to see if he needs any further urological evaluations.

## 2011-05-30 NOTE — Assessment & Plan Note (Signed)
His infection remains under excellent control. I will continue his Atripla.

## 2011-07-31 ENCOUNTER — Ambulatory Visit: Payer: Self-pay | Admitting: Internal Medicine

## 2011-08-18 ENCOUNTER — Ambulatory Visit (INDEPENDENT_AMBULATORY_CARE_PROVIDER_SITE_OTHER): Payer: Self-pay | Admitting: *Deleted

## 2011-08-18 VITALS — BP 116/79 | HR 96 | Temp 98.4°F | Resp 16 | Wt 243.8 lb

## 2011-08-18 DIAGNOSIS — B20 Human immunodeficiency virus [HIV] disease: Secondary | ICD-10-CM

## 2011-08-18 NOTE — Progress Notes (Signed)
Patient here for 16 month START study visit. He denies any new problems, except for what he has seen Dr. Orvan Falconer for lately (enuresis and back strain) He continues to be very adherent with his meds. He will return in 4 months for the next visit.

## 2011-09-07 ENCOUNTER — Encounter: Payer: Self-pay | Admitting: Infectious Disease

## 2011-09-07 LAB — CD4/CD8 (T-HELPER/T-SUPPRESSOR CELL)
CD4%: 47.7
CD8 % Suppressor T Cell: 34.2
CD8: 684

## 2011-12-08 ENCOUNTER — Ambulatory Visit (INDEPENDENT_AMBULATORY_CARE_PROVIDER_SITE_OTHER): Payer: Self-pay | Admitting: *Deleted

## 2011-12-08 VITALS — BP 131/84 | HR 85 | Temp 98.4°F | Resp 16 | Wt 249.5 lb

## 2011-12-08 DIAGNOSIS — B20 Human immunodeficiency virus [HIV] disease: Secondary | ICD-10-CM

## 2011-12-08 NOTE — Progress Notes (Signed)
Patient here for the 20 month START study appointment. He denies any new problems except for his hands aching more lately, R>L. He takes aleve for the pain which doesn't always help. He plans on getting a primary care provider soon, since he  has insurance. He has been very adherent and is in a committed relationship with an HIV negative partner.

## 2011-12-12 LAB — HIV-1 RNA QUANT-NO REFLEX-BLD
HIV 1 RNA Quant: <20 copies/mL (ref <20)
HIV-1 RNA Quant, Log: <1.3 {Log} (ref <1.30)

## 2011-12-22 LAB — CD4/CD8 (T-HELPER/T-SUPPRESSOR CELL): CD8: 633

## 2011-12-29 ENCOUNTER — Encounter: Payer: Self-pay | Admitting: Internal Medicine

## 2012-03-25 ENCOUNTER — Ambulatory Visit (INDEPENDENT_AMBULATORY_CARE_PROVIDER_SITE_OTHER): Payer: BLUE CROSS/BLUE SHIELD | Admitting: Physician Assistant

## 2012-03-25 VITALS — BP 134/88 | HR 94 | Temp 98.1°F | Resp 18 | Ht 71.0 in | Wt 242.8 lb

## 2012-03-25 DIAGNOSIS — L259 Unspecified contact dermatitis, unspecified cause: Secondary | ICD-10-CM

## 2012-03-25 DIAGNOSIS — IMO0002 Reserved for concepts with insufficient information to code with codable children: Secondary | ICD-10-CM

## 2012-03-25 DIAGNOSIS — M549 Dorsalgia, unspecified: Secondary | ICD-10-CM

## 2012-03-25 DIAGNOSIS — B20 Human immunodeficiency virus [HIV] disease: Secondary | ICD-10-CM | POA: Insufficient documentation

## 2012-03-25 DIAGNOSIS — S39012A Strain of muscle, fascia and tendon of lower back, initial encounter: Secondary | ICD-10-CM

## 2012-03-25 DIAGNOSIS — L309 Dermatitis, unspecified: Secondary | ICD-10-CM

## 2012-03-25 MED ORDER — MELOXICAM 15 MG PO TABS: 15.0000 mg | ORAL_TABLET | Freq: Every day | ORAL | Status: DC

## 2012-03-25 MED ORDER — CYCLOBENZAPRINE HCL 10 MG PO TABS: 10.0000 mg | ORAL_TABLET | Freq: Three times a day (TID) | ORAL | Status: DC | PRN

## 2012-03-25 NOTE — Progress Notes (Signed)
Subjective:    Patient ID: Timothy Perry, male    DOB: 1975-09-23, 36 y.o.   MRN: [id number]  HPI This 36 y.o. male presents for evaluation of upper back pain that began 03/21/2012.  No specific injury, but does heavy lifting at work.  Pain is on the left, between the shoulder blade and spine.  Has had this pain previously with stress.  Tried massage, heat, acetaminophen and Aleve (minimal benefit), but without resolution.  2/10.  Deep breath 3/10.  Sudden movements causes 7-8/10.  He has used Flexeril previously with good results.  He has HIV, followed by ID.  No other PCP.    History reviewed.  He reports eczema that occurs in flares with stress.  Describes the flares as clusters of water blisters that hurt.  Review of Systems  As above.  Past Medical History  Diagnosis Date  . HIV (human immunodeficiency virus infection) 03/26/2009    [facility];  participant in START Study  . Priapism     occasional, mornings    History reviewed. No pertinent past surgical history.  Prior to Admission medications   Medication Sig Start Date End Date Taking? Authorizing Provider  efavirenz-emtricitabine-tenofovir (ATRIPLA) 600-200-300 MG per tablet Take 1 tablet by mouth at bedtime.   Yes Historical Provider, MD  Multiple Vitamins-Minerals (MULTIVITAMIN PO) Take by mouth.   Yes Historical Provider, MD  Naproxen Sodium (ALEVE) 220 MG CAPS Take by mouth.     Yes Historical Provider, MD    No Known Allergies  History   Social History  . Marital Status: Single    Spouse Name: [name]    Number of Children: 0  . Years of Education: 15   Occupational History  . Education administrator    Social History Main Topics  . Smoking status: Current Every Day Smoker -- 0.5 packs/day for 22 years    Types: Cigarettes  . Smokeless tobacco: Never Used   Comment: decreasing smoking due to cost  . Alcohol Use: 0.0 - 1.0 oz/week    0-2 drink(s) per week     social  . Drug Use: No  . Sexually Active: Yes --  Male partner(s)     inconsistent condom use   Other Topics Concern  . Not on file   Social History Narrative   Engaged.  Just bought a house together.    Family History  Problem Relation Age of Onset  . Thyroid disease Mother   . Asthma Brother        Objective:   Physical Exam  Blood pressure 134/88, pulse 94, temperature 98.1 F (36.7 C), temperature source Oral, resp. rate 18, height 5\' 11"  (1.803 m), weight 242 lb 12.8 oz (110.133 kg), SpO2 98.00%. Body mass index is 33.86 kg/(m^2). Well-developed, well nourished WM who is awake, alert and oriented, in NAD. HEENT: [place]/AT, PERRL, EOMI.  Sclera and conjunctiva are clear.  EAC are patent, TMs are normal in appearance. Nasal mucosa is pink and moist. OP is clear. Neck: supple, non-tender, no lymphadenopathy, thyromegaly. Heart: RRR, no murmur Lungs: normal effort, CTA Back: Tenderness and spasm palpable along the medial edge of the scapula and of the trapezius on the left.  FROM. Normal strength.  Normal sensation. Extremities: no cyanosis, clubbing or edema. Skin: warm and dry without rash. Psychologic: good mood and appropriate affect, normal speech and behavior.       Assessment & Plan:   1. Back pain, acute  meloxicam (MOBIC) 15 MG tablet, cyclobenzaprine (FLEXERIL) 10 MG  tablet  2. Back strain    3. Dermatitis  RTC for eval when next occurs.  Plan culture-suspect HSV.

## 2012-03-25 NOTE — Patient Instructions (Signed)
Warm compresses to the area for 20 minutes 2-3 times daily, and massage (by [name]) for 20 minutes 2-3 times daily will help.  If the Flexeril (cyclobenzaprine) makes you too sleepy to take during the day, just take it at bedtime. Come in the next time you have the eruption of blisters so we can collect a culture.

## 2012-04-05 ENCOUNTER — Ambulatory Visit (INDEPENDENT_AMBULATORY_CARE_PROVIDER_SITE_OTHER): Payer: Self-pay | Admitting: *Deleted

## 2012-04-05 VITALS — BP 125/89 | HR 82 | Temp 98.5°F | Resp 16 | Wt 249.5 lb

## 2012-04-05 DIAGNOSIS — Z21 Asymptomatic human immunodeficiency virus [HIV] infection status: Secondary | ICD-10-CM

## 2012-04-05 DIAGNOSIS — B20 Human immunodeficiency virus [HIV] disease: Secondary | ICD-10-CM

## 2012-04-05 LAB — CD4/CD8 (T-HELPER/T-SUPPRESSOR CELL)
CD4%: 49.1
CD4: 737

## 2012-04-05 LAB — POCT URINALYSIS DIPSTICK
Glucose, UA: NEGATIVE
Leukocytes, UA: NEGATIVE
Nitrite, UA: NEGATIVE
Protein, UA: NEGATIVE
Spec Grav, UA: 1.025
Urobilinogen, UA: 0.2

## 2012-04-05 LAB — COMPREHENSIVE METABOLIC PANEL
Albumin: 3.8 g/dL (ref 3.5–5.2)
Alkaline Phosphatase: 101 U/L (ref 39–117)
BUN: 12 mg/dL (ref 6–23)
CO2: 26 mEq/L (ref 19–32)
Calcium: 9.1 mg/dL (ref 8.4–10.5)
Glucose, Bld: 102 mg/dL — ABNORMAL HIGH (ref 70–99)
Potassium: 4.5 mEq/L (ref 3.5–5.3)
Sodium: 141 mEq/L (ref 135–145)
Total Protein: 6.7 g/dL (ref 6.0–8.3)

## 2012-04-05 LAB — LIPID PANEL
Cholesterol: 215 mg/dL — ABNORMAL HIGH (ref 0–200)
Total CHOL/HDL Ratio: 5.7 Ratio

## 2012-04-05 NOTE — Progress Notes (Signed)
Patient here for his 24 month START study visit. He was recently seen for his left shoulder ache which is a little better now. He denies any other problems currently. He has been very adherent with his meds and will return for the next study visit in February.

## 2012-04-08 LAB — HIV-1 RNA QUANT-NO REFLEX-BLD
HIV 1 RNA Quant: <20 copies/mL (ref <20)
HIV-1 RNA Quant, Log: <1.3 {Log} (ref <1.30)

## 2012-04-19 ENCOUNTER — Encounter: Payer: Self-pay | Admitting: Internal Medicine

## 2012-05-03 ENCOUNTER — Ambulatory Visit (INDEPENDENT_AMBULATORY_CARE_PROVIDER_SITE_OTHER): Payer: BLUE CROSS/BLUE SHIELD | Admitting: Emergency Medicine

## 2012-05-03 VITALS — BP 128/91 | HR 116 | Temp 98.5°F | Resp 16 | Ht 71.0 in | Wt 251.0 lb

## 2012-05-03 DIAGNOSIS — B029 Zoster without complications: Secondary | ICD-10-CM

## 2012-05-03 DIAGNOSIS — Z23 Encounter for immunization: Secondary | ICD-10-CM

## 2012-05-03 MED ORDER — VALACYCLOVIR HCL 1 G PO TABS: 1000.0000 mg | ORAL_TABLET | Freq: Two times a day (BID) | ORAL | Status: DC

## 2012-05-03 NOTE — Progress Notes (Signed)
[facility] [address] [place] [address] [phone]  Date:  05/03/2012   Name:  Timothy Perry   DOB:  April 29, 1976   MRN:  [id number]  PCP:  No primary provider on file.    Chief Complaint: Rash   History of Present Illness:  Timothy Perry is a 36 y.o. very pleasant male patient who presents with the following:  Has a history of recurrent eruptions of vesicular rash.  Pruritic and painful, describes as burning.  No fever or chills, no allergen contact.  Has HIV.  Denies other complaints.  Patient Active Problem List  Diagnosis  . HIV INFECTION  . dermatitis  . HEADACHE  . Low back pain  . Enuresis    Past Medical History  Diagnosis Date  . HIV (human immunodeficiency virus infection) 03/26/2009    RCID;  participant in START Study  . Priapism     occasional, mornings    No past surgical history on file.  History  Substance Use Topics  . Smoking status: Current Every Day Smoker -- 0.5 packs/day for 22 years    Types: Cigarettes  . Smokeless tobacco: Never Used     Comment: decreasing smoking due to cost  . Alcohol Use: 0.0 - 1.0 oz/week    0-2 drink(s) per week     Comment: social    Family History  Problem Relation Age of Onset  . Thyroid disease Mother   . Asthma Brother     No Known Allergies  Medication list has been reviewed and updated.  Current Outpatient Prescriptions on File Prior to Visit  Medication Sig Dispense Refill  . efavirenz-emtricitabine-tenofovir (ATRIPLA) 600-200-300 MG per tablet Take 1 tablet by mouth at bedtime.      . Multiple Vitamins-Minerals (MULTIVITAMIN PO) Take by mouth.      . Naproxen Sodium (ALEVE) 220 MG CAPS Take by mouth.        . cyclobenzaprine (FLEXERIL) 10 MG tablet Take 1 tablet (10 mg total) by mouth 3 (three) times daily as needed for muscle spasms.  30 tablet  0  . meloxicam (MOBIC) 15 MG tablet Take 1 tablet (15 mg total) by mouth daily.  30 tablet  1    Review of  Systems:  As per HPI, otherwise negative.    Physical Examination: Filed Vitals:   05/03/12 1737  BP: 128/91  Pulse: 116  Temp: 98.5 F (36.9 C)  Resp: 16   Filed Vitals:   05/03/12 1737  Height: 5\' 11"  (1.803 m)  Weight: 251 lb (113.853 kg)   Body mass index is 35.01 kg/(m^2). Ideal Body Weight: Weight in (lb) to have BMI = 25: 178.9    GEN: WDWN, NAD, Non-toxic, Alert & Oriented x 3 HEENT: Atraumatic, Normocephalic.  Ears and Nose: No external deformity. EXTR: No clubbing/cyanosis/edema NEURO: Normal gait.  PSYCH: Normally interactive. Conversant. Not depressed or anxious appearing.  Calm demeanor.  SKIN:  Coalescent grouping of vesicular lesions right upper arm on an erythematous base characteristic of varicella zoster.    Assessment and Plan: Shingles Viral culture Valtrex Flu shot Follow up as needed  Carmelina Dane, MD

## 2012-05-06 LAB — HERPES SIMPLEX VIRUS CULTURE: Organism ID, Bacteria: DETECTED

## 2012-05-07 ENCOUNTER — Telehealth: Payer: Self-pay | Admitting: *Deleted

## 2012-05-07 DIAGNOSIS — B029 Zoster without complications: Secondary | ICD-10-CM

## 2012-05-07 NOTE — Telephone Encounter (Signed)
Patient called and said he was just diagnosed with shingles at an urgent care center. He had a rash come up on his arm and scalp and they actually cultured it. They prescribed him valcyclovir for 2 weeks. He says that he has been having this rash about every 3-4 months. He had a lot of questions about it and wondered if he should come in and see his ID physician. I told him it probably wasn't necessary right now since he was getting treatment, but if he continues to have more outbreaks then he probably needs to be seen.

## 2012-08-08 NOTE — Progress Notes (Signed)
Reviewed and agree.

## 2012-08-09 ENCOUNTER — Ambulatory Visit (INDEPENDENT_AMBULATORY_CARE_PROVIDER_SITE_OTHER): Payer: Self-pay | Admitting: *Deleted

## 2012-08-09 VITALS — BP 146/90 | HR 98 | Temp 98.5°F | Resp 16 | Wt 260.8 lb

## 2012-08-09 DIAGNOSIS — B20 Human immunodeficiency virus [HIV] disease: Secondary | ICD-10-CM

## 2012-08-12 NOTE — Progress Notes (Signed)
Pt here for START study, 28 month. He is taking ATRIPLA and adhering to regimen. Pt denies any new problems, symptoms. He was seen at urgent care 04/2012 with an episode of shingles. He was treated and the rash has cleared. Non-fasting labs were drawn. Pt received $20.00 for study visit. Pt received study medications at pharmacy. Next appt scheduled for Thursday, December 12, 2012 @ 8:30am. Tacey Heap RN

## 2012-08-19 ENCOUNTER — Encounter: Payer: Self-pay | Admitting: Internal Medicine

## 2012-08-19 LAB — CD4/CD8 (T-HELPER/T-SUPPRESSOR CELL)
CD4%: 46.1
CD4: 784

## 2012-08-24 ENCOUNTER — Other Ambulatory Visit: Payer: Self-pay | Admitting: Emergency Medicine

## 2012-09-05 ENCOUNTER — Encounter: Payer: Self-pay | Admitting: Internal Medicine

## 2012-11-02 ENCOUNTER — Other Ambulatory Visit: Payer: Self-pay | Admitting: Physician Assistant

## 2012-11-06 ENCOUNTER — Other Ambulatory Visit: Payer: Self-pay | Admitting: Physician Assistant

## 2012-11-07 ENCOUNTER — Other Ambulatory Visit: Payer: Self-pay | Admitting: Physician Assistant

## 2012-11-08 ENCOUNTER — Other Ambulatory Visit: Payer: Self-pay | Admitting: Physician Assistant

## 2012-11-10 ENCOUNTER — Ambulatory Visit (INDEPENDENT_AMBULATORY_CARE_PROVIDER_SITE_OTHER): Payer: BLUE CROSS/BLUE SHIELD | Admitting: Emergency Medicine

## 2012-11-10 VITALS — BP 138/94 | HR 115 | Temp 98.8°F | Resp 16 | Ht 71.5 in | Wt 260.8 lb

## 2012-11-10 DIAGNOSIS — M549 Dorsalgia, unspecified: Secondary | ICD-10-CM

## 2012-11-10 DIAGNOSIS — B20 Human immunodeficiency virus [HIV] disease: Secondary | ICD-10-CM

## 2012-11-10 DIAGNOSIS — G5681 Other specified mononeuropathies of right upper limb: Secondary | ICD-10-CM

## 2012-11-10 DIAGNOSIS — G5680 Other specified mononeuropathies of unspecified upper limb: Secondary | ICD-10-CM

## 2012-11-10 MED ORDER — CYCLOBENZAPRINE HCL 10 MG PO TABS: 10.0000 mg | ORAL_TABLET | Freq: Three times a day (TID) | ORAL | Status: DC | PRN

## 2012-11-10 MED ORDER — HYDROCODONE-ACETAMINOPHEN 5-325 MG PO TABS: 1.0000 | ORAL_TABLET | ORAL | Status: DC | PRN

## 2012-11-10 MED ORDER — NAPROXEN SODIUM 550 MG PO TABS: 550.0000 mg | ORAL_TABLET | Freq: Two times a day (BID) | ORAL | Status: AC

## 2012-11-10 MED ORDER — METHYLPREDNISOLONE ACETATE 80 MG/ML IJ SUSP: 80.0000 mg | Freq: Once | INTRAMUSCULAR | Status: AC

## 2012-11-10 NOTE — Progress Notes (Signed)
[facility] [address] [place] [address] [phone]  Date:  11/10/2012   Name:  Timothy Perry   DOB:  1976/02/17   MRN:  [id number]  PCP:  No PCP Per Patient    Chief Complaint: Back Pain, Wrist Pain and Arm Pain   History of Present Illness:  Timothy Perry is a 37 y.o. very pleasant male patient who presents with the following:  History of previous upper back pain.  Now has recurrence of pain.  Says that he lifts heavy objects at work.  Pain not radiating into his arms initially but now has some pain n his posterior upper arm and wrist.  No history of injury or overuse.  No improvement with over the counter medications or other home remedies. Denies other complaint or health concern today.    Patient Active Problem List   Diagnosis Date Noted  . Herpes zoster 05/07/2012  . Low back pain 05/09/2011  . Enuresis 05/09/2011  . dermatitis 02/16/2010  . HEADACHE 02/16/2010  . HIV INFECTION 10/19/2009    Past Medical History  Diagnosis Date  . HIV (human immunodeficiency virus infection) 03/26/2009    RCID;  participant in START Study  . Priapism     occasional, mornings  . Substance abuse     History reviewed. No pertinent past surgical history.  History  Substance Use Topics  . Smoking status: Current Every Day Smoker -- 0.50 packs/day for 22 years    Types: Cigarettes  . Smokeless tobacco: Never Used     Comment: decreasing smoking due to cost  . Alcohol Use: 0 - 1 oz/week    0-2 drink(s) per week     Comment: social    Family History  Problem Relation Age of Onset  . Thyroid disease Mother   . Asthma Brother     No Known Allergies  Medication list has been reviewed and updated.  Current Outpatient Prescriptions on File Prior to Visit  Medication Sig Dispense Refill  . efavirenz-emtricitabine-tenofovir (ATRIPLA) 600-200-300 MG per tablet Take 1 tablet by mouth at bedtime.      . Multiple Vitamins-Minerals  (MULTIVITAMIN PO) Take by mouth.      . Naproxen Sodium (ALEVE) 220 MG CAPS Take by mouth.        . valACYclovir (VALTREX) 1000 MG tablet TAKE 1 TABLET (1,000 MG TOTAL) BY MOUTH 2 (TWO) TIMES DAILY.  20 tablet  0  . cyclobenzaprine (FLEXERIL) 10 MG tablet Take 1 tablet (10 mg total) by mouth 3 (three) times daily as needed for muscle spasms.  30 tablet  0  . meloxicam (MOBIC) 15 MG tablet Take 1 tablet (15 mg total) by mouth daily.  30 tablet  1   No current facility-administered medications on file prior to visit.    Review of Systems:  As per HPI, otherwise negative.   Physical Examination: Filed Vitals:   11/10/12 1337  BP: 138/94  Pulse: 115  Temp: 98.8 F (37.1 C)  Resp: 16   Filed Vitals:   11/10/12 1337  Height: 5' 11.5" (1.816 m)  Weight: 260 lb 12.8 oz (118.298 kg)   Body mass index is 35.87 kg/(m^2). Ideal Body Weight: Weight in (lb) to have BMI = 25: 181.4   GEN: WDWN, NAD, Non-toxic, Alert & Oriented x 3 HEENT: Atraumatic, Normocephalic.  Ears and Nose: No external deformity. EXTR: No clubbing/cyanosis/edema NEURO: Normal gait. Motor and cerebellar intact PSYCH: Normally interactive. Conversant. Not depressed or anxious  appearing.  Calm demeanor.  Back:  Tender right trigger point medial to angle of scapula.   Assessment and Plan: Scapulocostal syndrome Depo medrol 100 mg Marcaine 2 ml Trigger point injection Local heat Anaprox Flexeril   Signed,  Phillips Odor, MD

## 2012-11-10 NOTE — Patient Instructions (Addendum)
Back Pain, Adult Low back pain is very common. About 1 in 5 people have back pain.The cause of low back pain is rarely dangerous. The pain often gets better over time.About half of people with a sudden onset of back pain feel better in just 2 weeks. About 8 in 10 people feel better by 6 weeks.  CAUSES Some common causes of back pain include:  Strain of the muscles or ligaments supporting the spine.  Wear and tear (degeneration) of the spinal discs.  Arthritis.  Direct injury to the back. DIAGNOSIS Most of the time, the direct cause of low back pain is not known.However, back pain can be treated effectively even when the exact cause of the pain is unknown.Answering your caregiver's questions about your overall health and symptoms is one of the most accurate ways to make sure the cause of your pain is not dangerous. If your caregiver needs more information, he or she may order lab work or imaging tests (X-rays or MRIs).However, even if imaging tests show changes in your back, this usually does not require surgery. HOME CARE INSTRUCTIONS For many people, back pain returns.Since low back pain is rarely dangerous, it is often a condition that people can learn to manageon their own.   Remain active. It is stressful on the back to sit or stand in one place. Do not sit, drive, or stand in one place for more than 30 minutes at a time. Take short walks on level surfaces as soon as pain allows.Try to increase the length of time you walk each day.  Do not stay in bed.Resting more than 1 or 2 days can delay your recovery.  Do not avoid exercise or work.Your body is made to move.It is not dangerous to be active, even though your back may hurt.Your back will likely heal faster if you return to being active before your pain is gone.  Pay attention to your body when you bend and lift. Many people have less discomfortwhen lifting if they bend their knees, keep the load close to their bodies,and  avoid twisting. Often, the most comfortable positions are those that put less stress on your recovering back.  Find a comfortable position to sleep. Use a firm mattress and lie on your side with your knees slightly bent. If you lie on your back, put a pillow under your knees.  Only take over-the-counter or prescription medicines as directed by your caregiver. Over-the-counter medicines to reduce pain and inflammation are often the most helpful.Your caregiver may prescribe muscle relaxant drugs.These medicines help dull your pain so you can more quickly return to your normal activities and healthy exercise.  Put ice on the injured area.  Put ice in a plastic bag.  Place a towel between your skin and the bag.  Leave the ice on for 15 to 20 minutes, 3 to 4 times a day for the first 2 to 3 days. After that, ice and heat may be alternated to reduce pain and spasms.  Ask your caregiver about trying back exercises and gentle massage. This may be of some benefit.  Avoid feeling anxious or stressed.Stress increases muscle tension and can worsen back pain.It is important to recognize when you are anxious or stressed and learn ways to manage it.Exercise is a great option. SEEK MEDICAL CARE IF:  You have pain that is not relieved with rest or medicine.  You have pain that does not improve in 1 week.  You have new symptoms.  You are generally   not feeling well. SEEK IMMEDIATE MEDICAL CARE IF:   You have pain that radiates from your back into your legs.  You develop new bowel or bladder control problems.  You have unusual weakness or numbness in your arms or legs.  You develop nausea or vomiting.  You develop abdominal pain.  You feel faint. Document Released: 06/12/2005 Document Revised: 12/12/2011 Document Reviewed: 10/31/2010 ExitCare Patient Information 2013 [employer].  

## 2012-12-13 ENCOUNTER — Ambulatory Visit (INDEPENDENT_AMBULATORY_CARE_PROVIDER_SITE_OTHER): Payer: Self-pay | Admitting: *Deleted

## 2012-12-13 VITALS — BP 118/88 | HR 85 | Temp 97.7°F | Wt 263.5 lb

## 2012-12-13 DIAGNOSIS — B20 Human immunodeficiency virus [HIV] disease: Secondary | ICD-10-CM

## 2012-12-13 DIAGNOSIS — Z21 Asymptomatic human immunodeficiency virus [HIV] infection status: Secondary | ICD-10-CM

## 2012-12-13 NOTE — Progress Notes (Signed)
Patient here for his 92 month START study visit. He says that he had a steroid injection in his back around April for continuing back pain, but he is still having some problems with it. No other new meds or problems, except for a questionable panic attack he had when he was pulled over by the police for a license check. He had chest pain with it and his partner was concerned about him needing to have his heart checked out. He is trying to get established with a primary care physician in the area. We discussed risks of cardiac disease ( genetic, smoking, obesity, high cholesterol, etc). He is trying to cut back on his smoking and using the vapor cigarettes. He doesn't seem to have a significant cardiac familial risk. He will return in October for his annual study visit when we will repeat an EKG and do a lipid panel.

## 2012-12-15 LAB — HIV-1 RNA QUANT-NO REFLEX-BLD
HIV 1 RNA Quant: <20 copies/mL (ref <20)
HIV-1 RNA Quant, Log: <1.3 {Log} (ref <1.30)

## 2013-01-01 ENCOUNTER — Encounter: Payer: Self-pay | Admitting: Infectious Disease

## 2013-01-01 LAB — CD4/CD8 (T-HELPER/T-SUPPRESSOR CELL)
CD4: 736
CD8 % Suppressor T Cell: 31.5

## 2013-03-28 ENCOUNTER — Ambulatory Visit (INDEPENDENT_AMBULATORY_CARE_PROVIDER_SITE_OTHER): Payer: Self-pay | Admitting: *Deleted

## 2013-03-28 ENCOUNTER — Other Ambulatory Visit: Payer: Self-pay

## 2013-03-28 VITALS — BP 115/83 | HR 79 | Temp 98.2°F | Resp 16 | Wt 268.0 lb

## 2013-03-28 DIAGNOSIS — Z21 Asymptomatic human immunodeficiency virus [HIV] infection status: Secondary | ICD-10-CM

## 2013-03-28 DIAGNOSIS — B20 Human immunodeficiency virus [HIV] disease: Secondary | ICD-10-CM

## 2013-03-28 DIAGNOSIS — Z0000 Encounter for general adult medical examination without abnormal findings: Secondary | ICD-10-CM

## 2013-03-28 DIAGNOSIS — Z23 Encounter for immunization: Secondary | ICD-10-CM

## 2013-03-28 LAB — COMPREHENSIVE METABOLIC PANEL
Albumin: 4.1 g/dL (ref 3.5–5.2)
Alkaline Phosphatase: 112 U/L (ref 39–117)
BUN: 11 mg/dL (ref 6–23)
Calcium: 9.8 mg/dL (ref 8.4–10.5)
Chloride: 108 mEq/L (ref 96–112)
Creat: 0.86 mg/dL (ref 0.50–1.35)
Glucose, Bld: 110 mg/dL — ABNORMAL HIGH (ref 70–99)
Potassium: 4.1 mEq/L (ref 3.5–5.3)

## 2013-03-28 LAB — POCT URINALYSIS DIPSTICK
Blood, UA: NEGATIVE
Glucose, UA: NEGATIVE
Spec Grav, UA: >=1.03
Urobilinogen, UA: 0.2
pH, UA: 5.5

## 2013-03-28 LAB — LIPID PANEL
HDL: 32 mg/dL — ABNORMAL LOW (ref >39)
Total CHOL/HDL Ratio: 5.3 Ratio
VLDL: 32 mg/dL (ref 0–40)

## 2013-03-28 NOTE — Progress Notes (Signed)
Pt here for START, month 36. He had shingles flare up about 2 weeks ago located to his right upper arm, right shoulder, and under his right eye. He stated that he did have blisters that popped, crusted, and healed. Upon examining there are no visible signs of blisters or crusting. All sites have healed/resolved. No changes to his medications and no other symptoms to report. Fasting labs were drawn and neurology tests were performed. He received his flu vaccine today along with $50 gift card for study visit. Next appt is in February. Tacey Heap RN

## 2013-03-31 LAB — CD4/CD8 (T-HELPER/T-SUPPRESSOR CELL)
CD4 % Helper T Cell: 49.1
CD4: 786

## 2013-03-31 LAB — HIV-1 RNA QUANT-NO REFLEX-BLD: HIV 1 RNA Quant: <20 copies/mL (ref <20)

## 2013-04-04 ENCOUNTER — Other Ambulatory Visit: Payer: Self-pay

## 2013-04-17 ENCOUNTER — Other Ambulatory Visit: Payer: Self-pay | Admitting: Physician Assistant

## 2013-04-23 ENCOUNTER — Ambulatory Visit
Admission: RE | Admit: 2013-04-23 | Discharge: 2013-04-23 | Disposition: A | Discharge status: Home / Self Care | Payer: BC Managed Care – PPO | Source: Ambulatory Visit | Attending: Physician Assistant | Admitting: Physician Assistant

## 2013-05-19 ENCOUNTER — Encounter: Payer: Self-pay | Admitting: Internal Medicine

## 2013-07-30 ENCOUNTER — Ambulatory Visit (INDEPENDENT_AMBULATORY_CARE_PROVIDER_SITE_OTHER): Payer: BC Managed Care – PPO | Admitting: Internal Medicine

## 2013-07-30 ENCOUNTER — Encounter: Payer: Self-pay | Admitting: Internal Medicine

## 2013-07-30 VITALS — BP 133/84 | HR 105 | Temp 98.2°F | Ht 71.0 in | Wt 259.0 lb

## 2013-07-30 DIAGNOSIS — R740 Nonspecific elevation of levels of transaminase and lactic acid dehydrogenase [LDH]: Secondary | ICD-10-CM

## 2013-07-30 DIAGNOSIS — Z202 Contact with and (suspected) exposure to infections with a predominantly sexual mode of transmission: Secondary | ICD-10-CM | POA: Insufficient documentation

## 2013-07-30 DIAGNOSIS — R7401 Elevation of levels of liver transaminase levels: Secondary | ICD-10-CM

## 2013-07-30 DIAGNOSIS — E785 Hyperlipidemia, unspecified: Secondary | ICD-10-CM

## 2013-07-30 DIAGNOSIS — B20 Human immunodeficiency virus [HIV] disease: Secondary | ICD-10-CM

## 2013-07-30 DIAGNOSIS — A64 Unspecified sexually transmitted disease: Secondary | ICD-10-CM

## 2013-07-30 MED ORDER — PENICILLIN G BENZATHINE 1200000 UNIT/2ML IM SUSP
1.2000 10*6.[IU] | Freq: Once | INTRAMUSCULAR | Status: AC
  Administered 2013-07-30: 1.2 10*6.[IU] via INTRAMUSCULAR

## 2013-07-30 NOTE — Assessment & Plan Note (Signed)
Elevated last check.  I discussed diet and lifestyle modifications.

## 2013-07-30 NOTE — Assessment & Plan Note (Signed)
Will treat and test.  Also check GC and chlamydia.

## 2013-07-30 NOTE — Assessment & Plan Note (Addendum)
Labs reviewed, some elevation.  He will continue with GI follow up.  No offending medications identified.

## 2013-07-30 NOTE — Assessment & Plan Note (Signed)
Doing well.  Some vivid dreams and I discussed taking on empty stomach. RTC 1 year and continue follow up with study

## 2013-07-30 NOTE — Progress Notes (Signed)
Patient ID: Timothy CourtStephen Tod Perry, male   DOB: 1976/01/16, 38 y.o.   MRN: [id number]   Subjective:    Patient ID: Timothy CourtStephen Tod Perry, male    DOB: 1976/01/16, 38 y.o.   MRN: [id number]  HPI:   Here for follow up.  Last seen over 1 year ago.  Does go regularly for study follow up. Continues on Atripla.  Denies missed doses.  Has had elevated AST and ALT with no etiology identified.  Has been followed by GI for it.  Is trying to lose weight, exercise more.  Recently found out he had a positive syphilis contact.  No symptoms, no lesions or ulcers.     Review of Systems  Constitutional: Negative for fever and unexpected weight change.  HENT: Negative for sore throat.   Gastrointestinal: Negative for nausea and diarrhea.  Genitourinary: Negative for dysuria and discharge.  Musculoskeletal: Negative for joint swelling.  Skin: Negative for rash.  Neurological: Negative for dizziness and light-headedness.  Hematological: Negative for adenopathy.       Objective:   Physical Exam  Constitutional: He appears well-developed and well-nourished. No distress.  HENT:  Mouth/Throat: No oropharyngeal exudate.  Eyes: Right eye exhibits no discharge. Left eye exhibits no discharge. No scleral icterus.  Cardiovascular: Normal rate, regular rhythm and normal heart sounds.   No murmur heard. Pulmonary/Chest: Effort normal and breath sounds normal. No respiratory distress. He has no wheezes.  Lymphadenopathy:    He has no cervical adenopathy.  Skin: No rash noted.  Psychiatric: He has a normal mood and affect.           Lab Results  Component Value Date   HIV1RNAQUANT <20 03/28/2013   HIV1RNAQUANT <20 12/13/2012   HIV1RNAQUANT <20 08/09/2012    Assessment & Plan:

## 2013-07-31 LAB — RPR

## 2013-08-08 ENCOUNTER — Ambulatory Visit (INDEPENDENT_AMBULATORY_CARE_PROVIDER_SITE_OTHER): Payer: Self-pay | Admitting: *Deleted

## 2013-08-08 VITALS — BP 111/78 | HR 74 | Temp 97.7°F | Wt 261.2 lb

## 2013-08-08 DIAGNOSIS — B20 Human immunodeficiency virus [HIV] disease: Secondary | ICD-10-CM

## 2013-08-08 DIAGNOSIS — Z21 Asymptomatic human immunodeficiency virus [HIV] infection status: Secondary | ICD-10-CM

## 2013-08-08 NOTE — Progress Notes (Signed)
Patient is here for month 40 START study visit. He denies any new problems and is very relieved that his STD tests were negative. He is seeing a GI specialist for elevated liver enzymes and said they were retested last week and were a little better. He is supposed to see Dr. Madilyn FiremanHayes today about it and may end up having a liver biopsy. He will return in June for the next study visit.

## 2013-08-09 LAB — HIV-1 RNA QUANT-NO REFLEX-BLD

## 2013-08-25 ENCOUNTER — Encounter: Payer: Self-pay | Admitting: Internal Medicine

## 2013-08-25 LAB — CD4/CD8 (T-HELPER/T-SUPPRESSOR CELL)
CD4%: 52
CD4: 676
CD8 % Suppressor T Cell: 27.6
CD8: 359

## 2013-11-28 ENCOUNTER — Ambulatory Visit (INDEPENDENT_AMBULATORY_CARE_PROVIDER_SITE_OTHER): Payer: Self-pay | Admitting: *Deleted

## 2013-11-28 VITALS — BP 125/83 | HR 89 | Temp 98.3°F | Wt 260.0 lb

## 2013-11-28 DIAGNOSIS — B20 Human immunodeficiency virus [HIV] disease: Secondary | ICD-10-CM

## 2013-11-28 DIAGNOSIS — Z21 Asymptomatic human immunodeficiency virus [HIV] infection status: Secondary | ICD-10-CM

## 2013-11-28 NOTE — Progress Notes (Signed)
Timothy Perry is here for his 64 month START study visit. He says he recently got over a bout with shingles that caused rashes on his left side in multiple places. (Back, face, arm) He was seen by his primary care doctor who gave him valtrex. He also had a perianal rash and hemorrhoidal pain which she gave him treatment for. He had also been taking flexeril for back strain. He is going to marry his partner of 4 years in a few weeks and the stress of preparing for the wedding prompted the shingles outbreak, he thinks. We discussed the new findings from the START study and he was given a copy of the letter for participants. He is interested in the Reprieve study and will be screened at the next study visit. He has been having elevated transaminases which his primary is following also. This may make him ineligible for Reprieve unless his ALT has come down enough to qualify.

## 2013-12-01 LAB — HIV-1 RNA QUANT-NO REFLEX-BLD
HIV 1 RNA Quant: <20 copies/mL (ref <20)
HIV-1 RNA Quant, Log: <1.3 {Log} (ref <1.30)

## 2013-12-08 ENCOUNTER — Encounter: Payer: Self-pay | Admitting: Internal Medicine

## 2013-12-08 LAB — CD4/CD8 (T-HELPER/T-SUPPRESSOR CELL)
CD4%: 50
CD4: 600
CD8 T CELL SUPPRESSOR: 27.9
CD8: 335

## 2013-12-30 ENCOUNTER — Encounter: Payer: Self-pay | Admitting: Internal Medicine

## 2014-04-03 ENCOUNTER — Ambulatory Visit (INDEPENDENT_AMBULATORY_CARE_PROVIDER_SITE_OTHER): Payer: BC Managed Care – PPO | Admitting: *Deleted

## 2014-04-03 VITALS — BP 107/77 | HR 78 | Temp 97.6°F | Resp 16 | Wt 263.2 lb

## 2014-04-03 DIAGNOSIS — Z006 Encounter for examination for normal comparison and control in clinical research program: Secondary | ICD-10-CM

## 2014-04-03 DIAGNOSIS — Z23 Encounter for immunization: Secondary | ICD-10-CM | POA: Diagnosis not present

## 2014-04-03 DIAGNOSIS — B20 Human immunodeficiency virus [HIV] disease: Secondary | ICD-10-CM

## 2014-04-03 LAB — LIPID PANEL
Cholesterol: 169 mg/dL (ref 0–200)
HDL: 29 mg/dL — ABNORMAL LOW (ref >39)
LDL Cholesterol: 104 mg/dL — ABNORMAL HIGH (ref 0–99)
Total CHOL/HDL Ratio: 5.8 Ratio
Triglycerides: 180 mg/dL — ABNORMAL HIGH (ref <150)
VLDL: 36 mg/dL (ref 0–40)

## 2014-04-03 LAB — COMPREHENSIVE METABOLIC PANEL
ALK PHOS: 109 U/L (ref 39–117)
ALT: 125 U/L — ABNORMAL HIGH (ref 0–53)
AST: 71 U/L — ABNORMAL HIGH (ref 0–37)
Albumin: 4.5 g/dL (ref 3.5–5.2)
BUN: 11 mg/dL (ref 6–23)
CALCIUM: 9.2 mg/dL (ref 8.4–10.5)
CHLORIDE: 105 meq/L (ref 96–112)
CO2: 27 mEq/L (ref 19–32)
Creat: 0.97 mg/dL (ref 0.50–1.35)
Glucose, Bld: 111 mg/dL — ABNORMAL HIGH (ref 70–99)
Potassium: 4.3 mEq/L (ref 3.5–5.3)
Sodium: 141 mEq/L (ref 135–145)
Total Bilirubin: 0.7 mg/dL (ref 0.2–1.2)
Total Protein: 7.8 g/dL (ref 6.0–8.3)

## 2014-04-03 LAB — POCT URINALYSIS DIPSTICK
Bilirubin, UA: NEGATIVE
Blood, UA: NEGATIVE
Glucose, UA: NEGATIVE
KETONES UA: NEGATIVE
LEUKOCYTES UA: NEGATIVE
Nitrite, UA: NEGATIVE
PH UA: 5.5
Protein, UA: NEGATIVE
Spec Grav, UA: >=1.03
Urobilinogen, UA: 0.2

## 2014-04-03 LAB — CD4/CD8 (T-HELPER/T-SUPPRESSOR CELL)
CD4%: 47.6
CD4: 857
CD8 T CELL SUPPRESSOR: 30.1
CD8: 542

## 2014-04-03 NOTE — Progress Notes (Signed)
Timothy Perry is here for his month 3748 START study visit and to screen for Reprieve. Informed consent was obtained after he had time to review the consent and we discussed it in detail. He recently had a liver biopsy for elevated liver enzymes which showed fatty liver disease. If his ALT is elevated over 132 he will not be eligible for Reprieve of which he is aware of. [facility]. He denies any new problems or other concerns. Remains on atripla and says his adherence is good. He did start taking vitamin E recently to help treat the steatosis. He returns for START in 4 months, and will come back sooner if eligible to enroll on Reprieve.

## 2014-04-04 LAB — HIV-1 RNA QUANT-NO REFLEX-BLD: HIV-1 RNA Quant, Log: <1.3 {Log} (ref <1.30)

## 2014-04-15 ENCOUNTER — Encounter: Payer: Self-pay | Admitting: Infectious Disease

## 2014-04-30 ENCOUNTER — Encounter: Payer: Self-pay | Admitting: Infectious Disease

## 2014-06-15 ENCOUNTER — Encounter: Payer: Self-pay | Admitting: Infectious Disease

## 2014-06-15 ENCOUNTER — Ambulatory Visit (INDEPENDENT_AMBULATORY_CARE_PROVIDER_SITE_OTHER): Payer: BC Managed Care – PPO | Admitting: *Deleted

## 2014-06-15 VITALS — BP 130/83 | HR 80 | Temp 98.6°F | Resp 16 | Wt 266.5 lb

## 2014-06-15 DIAGNOSIS — B20 Human immunodeficiency virus [HIV] disease: Secondary | ICD-10-CM

## 2014-06-15 DIAGNOSIS — Z006 Encounter for examination for normal comparison and control in clinical research program: Secondary | ICD-10-CM

## 2014-06-15 LAB — CD4/CD8 (T-HELPER/T-SUPPRESSOR CELL)
CD4%: 47.6
CD4: 857
CD8 % Suppressor T Cell: 30.1
CD8: 542

## 2014-06-15 MED ORDER — PITAVASTATIN CALCIUM 4 MG PO TABS: 1.0000 | ORAL_TABLET | Freq: Every day | ORAL | Status: DC

## 2014-06-15 NOTE — Progress Notes (Signed)
Timothy Perry enrolled on the Reprieve study today. He was randomized to pitavastatin 4mg  or placebo daily. He plans to start taking them today. He was instructed on dosing, side effects, when to call for problems with drug such as rash, swelling or other signs of allergic response. He says his lower back started bothering him 2 days ago and sciatica is radiating down his left leg. He takes flexeril when it is bothering him. He was also told to watch for severe muscle aches and to call if they start. He will return in 1 month for the next study visit.

## 2014-07-24 ENCOUNTER — Ambulatory Visit (INDEPENDENT_AMBULATORY_CARE_PROVIDER_SITE_OTHER): Payer: Self-pay | Admitting: *Deleted

## 2014-07-24 VITALS — BP 112/78 | HR 82 | Temp 98.3°F | Wt 260.0 lb

## 2014-07-24 DIAGNOSIS — Z006 Encounter for examination for normal comparison and control in clinical research program: Secondary | ICD-10-CM

## 2014-07-24 LAB — COMPREHENSIVE METABOLIC PANEL
ALT: 116 U/L — AB (ref 0–53)
AST: 51 U/L — ABNORMAL HIGH (ref 0–37)
Albumin: 4.2 g/dL (ref 3.5–5.2)
Alkaline Phosphatase: 101 U/L (ref 39–117)
BILIRUBIN TOTAL: 0.6 mg/dL (ref 0.2–1.2)
BUN: 13 mg/dL (ref 6–23)
CO2: 26 mEq/L (ref 19–32)
Calcium: 9.2 mg/dL (ref 8.4–10.5)
Chloride: 106 mEq/L (ref 96–112)
Creat: 0.87 mg/dL (ref 0.50–1.35)
GLUCOSE: 83 mg/dL (ref 70–99)
Potassium: 4.3 mEq/L (ref 3.5–5.3)
SODIUM: 140 meq/L (ref 135–145)
Total Protein: 6.8 g/dL (ref 6.0–8.3)

## 2014-07-24 NOTE — Progress Notes (Signed)
Timothy Perry is here for his 1 month Reprieve study visit. He says he hasn't noticed any problems with taking the study med. His sides have been hurting some, but that may be attributed to his back and shoveling snow. He hasn't noticed any generalized muscle aches or weakness. He will return in 2 weeks for the START study visit

## 2014-08-07 ENCOUNTER — Ambulatory Visit (INDEPENDENT_AMBULATORY_CARE_PROVIDER_SITE_OTHER): Payer: Self-pay | Admitting: *Deleted

## 2014-08-07 VITALS — BP 126/89 | HR 81 | Temp 97.7°F | Resp 16 | Wt 261.5 lb

## 2014-08-07 DIAGNOSIS — Z006 Encounter for examination for normal comparison and control in clinical research program: Secondary | ICD-10-CM

## 2014-08-07 NOTE — Progress Notes (Signed)
Timothy Perry is here today for the START study, 52 month visit. He says that he had an episode that bothered him Monday morning around 3:30am. He woke up with severe heartburn, the pain went through to his back and around the sides. He felt clammy, SOB and some chest tightness. It gradually subsided but he did not seek medical care right away. He said he felt bad the rest of the day and scheduled a visit with his primary care for this morning after the study appt. He will return in April for the next visits for START and Reprieve.

## 2014-08-08 LAB — HIV-1 RNA QUANT-NO REFLEX-BLD: HIV-1 RNA Quant, Log: <1.3 {Log} (ref <1.30)

## 2014-08-13 ENCOUNTER — Encounter: Payer: Self-pay | Admitting: Internal Medicine

## 2014-08-13 LAB — CD4/CD8 (T-HELPER/T-SUPPRESSOR CELL)
CD4 % Helper T Cell: 45.1
CD4: 767
CD8 T CELL SUPPRESSOR: 28.6
CD8: 486

## 2014-08-14 ENCOUNTER — Other Ambulatory Visit (HOSPITAL_COMMUNITY): Payer: Self-pay | Admitting: Physician Assistant

## 2014-08-14 DIAGNOSIS — R079 Chest pain, unspecified: Secondary | ICD-10-CM

## 2014-08-27 ENCOUNTER — Telehealth (HOSPITAL_COMMUNITY): Payer: Self-pay

## 2014-08-27 NOTE — Telephone Encounter (Signed)
Encounter complete. 

## 2014-08-28 ENCOUNTER — Telehealth (HOSPITAL_COMMUNITY): Payer: Self-pay

## 2014-08-28 NOTE — Telephone Encounter (Signed)
Encounter complete. 

## 2014-09-01 ENCOUNTER — Ambulatory Visit (HOSPITAL_COMMUNITY)
Admission: RE | Admit: 2014-09-01 | Discharge: 2014-09-01 | Disposition: A | Discharge status: Home / Self Care | Payer: BLUE CROSS/BLUE SHIELD | Source: Ambulatory Visit | Attending: Cardiology | Admitting: Cardiology

## 2014-09-01 DIAGNOSIS — R079 Chest pain, unspecified: Secondary | ICD-10-CM | POA: Insufficient documentation

## 2014-09-01 NOTE — Procedures (Signed)
Exercise Treadmill Test   Test  Exercise Tolerance Test Ordering MD: Milus HeightNoelle Redmon, PA    Unique Test No: 1  Treadmill:  1  Indication for ETT: chest pain - rule out ischemia  Contraindication to ETT: No   Stress Modality: exercise - treadmill  Cardiac Imaging Performed: non   Protocol: standard Bruce - maximal  Max BP:  139/103  Max MPHR (bpm):  180 85% MPR (bpm):  153  MPHR obtained (bpm):  184 % MPHR obtained:  10  Reached 85% MPHR (min:sec):  102 Total Exercise Time (min-sec):  8:30  Workload in METS:  10.1 Borg Scale: 15  Reason ETT Terminated:  SOB, Fatigue and Leg Discomfort    ST Segment Analysis At Rest: normal ST segments - no evidence of significant ST depression With Exercise: no evidence of significant ST depression  Other Information Arrhythmia:  No Angina during ETT:  absent (0) Quality of ETT:  diagnostic  ETT Interpretation:  normal - no evidence of ischemia by ST analysis  Comments: Good exercise tolerance No ischemic EKG changes Duke Treadmill Score of +8  Timothy NoseKenneth C. Samuel Rittenhouse, MD, [facility] Center[facility]ACC Attending Cardiologist [facility]

## 2014-09-03 ENCOUNTER — Other Ambulatory Visit: Payer: Self-pay | Admitting: *Deleted

## 2014-09-03 DIAGNOSIS — Z113 Encounter for screening for infections with a predominantly sexual mode of transmission: Secondary | ICD-10-CM

## 2014-10-16 ENCOUNTER — Ambulatory Visit: Payer: BLUE CROSS/BLUE SHIELD | Admitting: *Deleted

## 2014-10-16 ENCOUNTER — Encounter: Payer: Self-pay | Admitting: *Deleted

## 2014-10-16 VITALS — BP 112/76 | HR 87 | Temp 98.3°F | Resp 16 | Wt 257.0 lb

## 2014-10-16 DIAGNOSIS — Z006 Encounter for examination for normal comparison and control in clinical research program: Secondary | ICD-10-CM

## 2014-10-16 NOTE — Progress Notes (Signed)
[name] is here for A5332 - mth4, START - mth54, [id number] - entry. We reviewed the informed consent for [phone] and updated informed consents and letters for [phone] [id number] and START together. I fully explained the consent, risks, benefits, responsibilities, and answered questions. Participant verbalized understanding and signed the consents and initialed letters witnessed by me. I gave a copy of the signed consents and letters to participant.  Non-fasting labs were drawn and vital signs are stable. He continues to have back pain with prior back strain and haas been taking flexeril and aleve as needed. Chest tightness has resolved and stress test was negative. Cardiology appointment pending. He received $80 gift card for all study visits. Next appointment scheduled for:  A5332 - Friday, February 05, 2015 @ 8am. START - Friday, April 16, 2015 @ 8am.

## 2014-10-19 LAB — HIV-1 RNA QUANT-NO REFLEX-BLD
HIV 1 RNA Quant: <20 copies/mL (ref <20)
HIV-1 RNA Quant, Log: <1.3 {Log} (ref <1.30)

## 2014-10-21 ENCOUNTER — Encounter: Payer: Self-pay | Admitting: Infectious Disease

## 2014-10-21 LAB — CD4/CD8 (T-HELPER/T-SUPPRESSOR CELL)
CD4%: 49.2
CD4: 984
CD8 % Suppressor T Cell: 29.3
CD8: 586

## 2015-01-29 ENCOUNTER — Encounter (INDEPENDENT_AMBULATORY_CARE_PROVIDER_SITE_OTHER): Payer: Self-pay | Admitting: *Deleted

## 2015-01-29 VITALS — BP 116/86 | HR 87 | Temp 98.0°F | Resp 17 | Wt 261.2 lb

## 2015-01-29 DIAGNOSIS — Z006 Encounter for examination for normal comparison and control in clinical research program: Secondary | ICD-10-CM

## 2015-01-29 NOTE — Progress Notes (Signed)
Timothy Perry is here for A5332, month 8. He denies any new problems. He just replace his floor in his house last night and has some muscle soreness from that. Pill count performed and #59 remain. He states he did miss a few couple since the last time we saw him. Study medication was dispensed. He received $50 gift card for visit. Next appt is in December. Tacey Heap RN

## 2015-04-16 ENCOUNTER — Encounter (INDEPENDENT_AMBULATORY_CARE_PROVIDER_SITE_OTHER): Payer: BLUE CROSS/BLUE SHIELD | Admitting: *Deleted

## 2015-04-16 VITALS — BP 117/76 | HR 73 | Temp 98.4°F | Resp 16 | Wt 264.5 lb

## 2015-04-16 DIAGNOSIS — Z23 Encounter for immunization: Secondary | ICD-10-CM | POA: Diagnosis not present

## 2015-04-16 DIAGNOSIS — Z006 Encounter for examination for normal comparison and control in clinical research program: Secondary | ICD-10-CM

## 2015-04-16 LAB — LIPID PANEL
CHOL/HDL RATIO: 6.2 ratio — AB (ref <=5.0)
CHOLESTEROL: 168 mg/dL (ref 125–200)
HDL: 27 mg/dL — AB (ref >=40)
LDL Cholesterol: 100 mg/dL (ref <130)
Triglycerides: 207 mg/dL — ABNORMAL HIGH (ref <150)
VLDL: 41 mg/dL — ABNORMAL HIGH (ref <30)

## 2015-04-16 LAB — COMPREHENSIVE METABOLIC PANEL
ALK PHOS: 91 U/L (ref 40–115)
ALT: 137 U/L — ABNORMAL HIGH (ref 9–46)
AST: 72 U/L — ABNORMAL HIGH (ref 10–40)
Albumin: 4.1 g/dL (ref 3.6–5.1)
BUN: 12 mg/dL (ref 7–25)
CHLORIDE: 106 mmol/L (ref 98–110)
CO2: 26 mmol/L (ref 20–31)
Calcium: 9.1 mg/dL (ref 8.6–10.3)
Creat: 0.8 mg/dL (ref 0.60–1.35)
GLUCOSE: 96 mg/dL (ref 65–99)
POTASSIUM: 4.5 mmol/L (ref 3.5–5.3)
Sodium: 140 mmol/L (ref 135–146)
Total Bilirubin: 0.6 mg/dL (ref 0.2–1.2)
Total Protein: 7.2 g/dL (ref 6.1–8.1)

## 2015-04-16 LAB — CD4/CD8 (T-HELPER/T-SUPPRESSOR CELL)
CD4%: 50.7
CD4: 963
CD8 % Suppressor T Cell: 28.1
CD8: 534

## 2015-04-16 LAB — POCT URINALYSIS DIPSTICK
BILIRUBIN UA: NEGATIVE
Glucose, UA: NEGATIVE
Ketones, UA: NEGATIVE
Leukocytes, UA: NEGATIVE
NITRITE UA: NEGATIVE
Protein, UA: NEGATIVE
RBC UA: NEGATIVE
Spec Grav, UA: 1.025
Urobilinogen, UA: 0.2
pH, UA: 5.5

## 2015-04-16 NOTE — Progress Notes (Signed)
Timothy Perry is here for his month 4660 START study appt. He says that he developed a sharp pain in his rt testicle that extended up his rt side last week and it is still sore. He is seeing his regular primary care physician today and will have them check on that. Other than that he doesn't have any complaints. He continues to take his Atripla without problem. He says he occasionally misses when he gets real busy and forgets. He is working full-time and going to school fulltime at [employer]. He will return in 6 months for the next STARt study appt. The study will end at the end of 2017 and he will need to get his Atripla elsewhere.

## 2015-04-19 LAB — HIV-1 RNA QUANT-NO REFLEX-BLD
HIV 1 RNA Quant: <20 copies/mL (ref <20)
HIV-1 RNA Quant, Log: <1.3 Log copies/mL (ref <1.30)

## 2015-04-29 ENCOUNTER — Encounter: Payer: Self-pay | Admitting: Internal Medicine

## 2015-05-17 ENCOUNTER — Encounter: Payer: Self-pay | Admitting: Infectious Disease

## 2015-06-03 ENCOUNTER — Ambulatory Visit: Payer: BLUE CROSS/BLUE SHIELD | Admitting: Infectious Disease

## 2015-06-17 ENCOUNTER — Encounter (INDEPENDENT_AMBULATORY_CARE_PROVIDER_SITE_OTHER): Payer: BLUE CROSS/BLUE SHIELD | Admitting: *Deleted

## 2015-06-17 VITALS — BP 117/83 | HR 72 | Temp 98.2°F | Resp 16 | Wt 276.2 lb

## 2015-06-17 DIAGNOSIS — Z006 Encounter for examination for normal comparison and control in clinical research program: Secondary | ICD-10-CM

## 2015-06-17 LAB — COMPREHENSIVE METABOLIC PANEL
ALBUMIN: 4.1 g/dL (ref 3.6–5.1)
ALT: 233 U/L — ABNORMAL HIGH (ref 9–46)
AST: 116 U/L — ABNORMAL HIGH (ref 10–40)
Alkaline Phosphatase: 101 U/L (ref 40–115)
BILIRUBIN TOTAL: 0.6 mg/dL (ref 0.2–1.2)
BUN: 10 mg/dL (ref 7–25)
CO2: 26 mmol/L (ref 20–31)
Calcium: 9.3 mg/dL (ref 8.6–10.3)
Chloride: 105 mmol/L (ref 98–110)
Creat: 0.88 mg/dL (ref 0.60–1.35)
Glucose, Bld: 122 mg/dL — ABNORMAL HIGH (ref 65–99)
POTASSIUM: 4.3 mmol/L (ref 3.5–5.3)
Sodium: 140 mmol/L (ref 135–146)
TOTAL PROTEIN: 7.2 g/dL (ref 6.1–8.1)

## 2015-06-17 NOTE — Progress Notes (Signed)
Timothy Perry is here for his 12 month visit for Reprieve, A Randomized Trial to Prevent Vascular Events in HIV (study drug is Pitavastatin 4mg  or placebo). He denies any major problems. He continues to have some lower back pain with radiating pain down his left leg. He takes NSAIDS and flexeril for that occasionally. Also, has had a migraine recently that he said was worse than usual. His adherence is good. He will return in April for his next Reprieve and START study appt.

## 2015-07-01 ENCOUNTER — Encounter (INDEPENDENT_AMBULATORY_CARE_PROVIDER_SITE_OTHER): Payer: Self-pay | Admitting: *Deleted

## 2015-07-01 DIAGNOSIS — Z006 Encounter for examination for normal comparison and control in clinical research program: Secondary | ICD-10-CM

## 2015-07-01 LAB — COMPREHENSIVE METABOLIC PANEL
ALT: 283 U/L — ABNORMAL HIGH (ref 9–46)
AST: 179 U/L — AB (ref 10–40)
Albumin: 4.2 g/dL (ref 3.6–5.1)
Alkaline Phosphatase: 94 U/L (ref 40–115)
BILIRUBIN TOTAL: 0.6 mg/dL (ref 0.2–1.2)
BUN: 12 mg/dL (ref 7–25)
CHLORIDE: 103 mmol/L (ref 98–110)
CO2: 23 mmol/L (ref 20–31)
CREATININE: 0.92 mg/dL (ref 0.60–1.35)
Calcium: 9.6 mg/dL (ref 8.6–10.3)
GLUCOSE: 133 mg/dL — AB (ref 65–99)
Potassium: 4.1 mmol/L (ref 3.5–5.3)
SODIUM: 137 mmol/L (ref 135–146)
Total Protein: 7.3 g/dL (ref 6.1–8.1)

## 2015-07-01 NOTE — Progress Notes (Signed)
Timothy Perry came by today to have his CMET rechecked for elevated ALT. He denies any symptoms currently and has stopped his medication when directed.

## 2015-07-14 ENCOUNTER — Encounter (INDEPENDENT_AMBULATORY_CARE_PROVIDER_SITE_OTHER): Payer: Self-pay | Admitting: *Deleted

## 2015-07-14 DIAGNOSIS — Z006 Encounter for examination for normal comparison and control in clinical research program: Secondary | ICD-10-CM

## 2015-07-14 LAB — COMPREHENSIVE METABOLIC PANEL
ALBUMIN: 4.1 g/dL (ref 3.6–5.1)
ALT: 324 U/L — ABNORMAL HIGH (ref 9–46)
AST: 175 U/L — AB (ref 10–40)
Alkaline Phosphatase: 89 U/L (ref 40–115)
BILIRUBIN TOTAL: 0.6 mg/dL (ref 0.2–1.2)
BUN: 15 mg/dL (ref 7–25)
CHLORIDE: 104 mmol/L (ref 98–110)
CO2: 22 mmol/L (ref 20–31)
Calcium: 9 mg/dL (ref 8.6–10.3)
Creat: 0.97 mg/dL (ref 0.60–1.35)
Glucose, Bld: 91 mg/dL (ref 65–99)
Potassium: 4.3 mmol/L (ref 3.5–5.3)
Sodium: 138 mmol/L (ref 135–146)
Total Protein: 7 g/dL (ref 6.1–8.1)

## 2015-07-14 NOTE — Progress Notes (Signed)
Timothy Perry is here today to recheck his CMET for the Reprieve, A Randomized Trial to Prevent Vascular Events in HIV (study drug is Pitavastatin  or placebo). His ALT is up to 283 and per study his pitavastatin is supposed to be held. Depending on what the ALT is today will determine if he restarts study drug.

## 2015-07-16 ENCOUNTER — Encounter: Payer: Self-pay | Admitting: *Deleted

## 2015-07-16 NOTE — Progress Notes (Signed)
Tod's ALT from 1/18 is now 324, the highest it has ever been. He has been off of pitavastatin/placebo since 12/27. The study team wants [place] to keep rechecking his hepatic enzymes every 2 weeks and check an acute hepatitis panel. Cleopatra Cedar denies any complaints of hepatitis, high alcohol ingestion or exposure to chemicals other than at work. I have discussed this with him and he is to consult with his primary care MD. He is coming back 2/8 for repeat labs for study.

## 2015-08-04 ENCOUNTER — Encounter (INDEPENDENT_AMBULATORY_CARE_PROVIDER_SITE_OTHER): Payer: Self-pay | Admitting: *Deleted

## 2015-08-04 VITALS — Wt 274.0 lb

## 2015-08-04 DIAGNOSIS — Z006 Encounter for examination for normal comparison and control in clinical research program: Secondary | ICD-10-CM

## 2015-08-04 LAB — COMPREHENSIVE METABOLIC PANEL
ALK PHOS: 88 U/L (ref 40–115)
ALT: 237 U/L — ABNORMAL HIGH (ref 9–46)
AST: 139 U/L — ABNORMAL HIGH (ref 10–40)
Albumin: 4 g/dL (ref 3.6–5.1)
BUN: 13 mg/dL (ref 7–25)
CALCIUM: 9.1 mg/dL (ref 8.6–10.3)
CHLORIDE: 103 mmol/L (ref 98–110)
CO2: 23 mmol/L (ref 20–31)
Creat: 0.95 mg/dL (ref 0.60–1.35)
Glucose, Bld: 108 mg/dL — ABNORMAL HIGH (ref 65–99)
POTASSIUM: 3.8 mmol/L (ref 3.5–5.3)
Sodium: 136 mmol/L (ref 135–146)
TOTAL PROTEIN: 7.2 g/dL (ref 6.1–8.1)
Total Bilirubin: 0.8 mg/dL (ref 0.2–1.2)

## 2015-08-04 NOTE — Progress Notes (Signed)
Timothy Perry is here for his retest of labs for the Reprieve, A Randomized Trial to Prevent Vascular Events in HIV (study drug is Pitavastatin  or placebo). His last ALT was 324, grade 3 and he has been holding his pitavastatin/placebo. He has managed to lose over 2 lbs since his last visit and has changed his diet (cutting back on sweets) so hopefully his ALT will start trending down. He has not been to see his primary care MD yet to evaluate the elevated labs. He continues to deny any alcohol; or substance use. We did check an acute hepatitis panel at this visit as well as a CMET.. Will reschedule his next visit dependent on his lab results.

## 2015-08-05 LAB — HEPATITIS PANEL, ACUTE
HCV Ab: NEGATIVE
HEP B C IGM: NONREACTIVE
Hep A IgM: NONREACTIVE
Hepatitis B Surface Ag: NEGATIVE

## 2015-10-13 ENCOUNTER — Encounter (INDEPENDENT_AMBULATORY_CARE_PROVIDER_SITE_OTHER): Payer: Self-pay | Admitting: *Deleted

## 2015-10-13 VITALS — BP 113/76 | HR 80 | Temp 98.8°F | Resp 16 | Wt 267.5 lb

## 2015-10-13 DIAGNOSIS — Z006 Encounter for examination for normal comparison and control in clinical research program: Secondary | ICD-10-CM

## 2015-10-13 LAB — COMPREHENSIVE METABOLIC PANEL
ALBUMIN: 4 g/dL (ref 3.6–5.1)
ALT: 179 U/L — ABNORMAL HIGH (ref 9–46)
AST: 118 U/L — AB (ref 10–40)
Alkaline Phosphatase: 93 U/L (ref 40–115)
BILIRUBIN TOTAL: 0.8 mg/dL (ref 0.2–1.2)
BUN: 11 mg/dL (ref 7–25)
CHLORIDE: 101 mmol/L (ref 98–110)
CO2: 27 mmol/L (ref 20–31)
CREATININE: 1.01 mg/dL (ref 0.60–1.35)
Calcium: 9 mg/dL (ref 8.6–10.3)
GLUCOSE: 81 mg/dL (ref 65–99)
Potassium: 3.8 mmol/L (ref 3.5–5.3)
SODIUM: 136 mmol/L (ref 135–146)
Total Protein: 7.1 g/dL (ref 6.1–8.1)

## 2015-10-13 LAB — CD4/CD8 (T-HELPER/T-SUPPRESSOR CELL)
CD4%: 50.4
CD4: 1361

## 2015-10-13 NOTE — Progress Notes (Signed)
Timothy Perry is here for his month 5466 START study visit and month 16 Reprieve study visit. His pitavastatin was being held due to elevated ALT, grade 3 at last check. If it is down to grade 2 will check with study team about restarting it. He denies any new problems or concerns and has been trying to watch what he eats to lose some weight and see if that helps bring down his liver enzymes (if not related to study meds). His next Reprieve visit will be in August and then his last START visit will be sometime this Fall.

## 2015-10-15 LAB — HIV-1 RNA QUANT-NO REFLEX-BLD

## 2015-10-29 ENCOUNTER — Encounter: Payer: Self-pay | Admitting: Internal Medicine

## 2016-02-11 ENCOUNTER — Encounter (INDEPENDENT_AMBULATORY_CARE_PROVIDER_SITE_OTHER): Payer: Self-pay | Admitting: *Deleted

## 2016-02-11 VITALS — BP 111/77 | HR 74 | Temp 97.9°F | Wt 269.8 lb

## 2016-02-11 DIAGNOSIS — Z006 Encounter for examination for normal comparison and control in clinical research program: Secondary | ICD-10-CM

## 2016-02-11 NOTE — Progress Notes (Signed)
Timothy Perry is here for his month 20 visit for Reprieve, A Randomized Trial to Prevent Vascular Events in HIV (study drug is Pitavastatin 4mg  or placebo).  He denies any new problems or concerns. He says his adherence to the study drug is excellent and denies any muscle aches or weakness. He has been very busy working full-time and going to school for substance abuse counseling at [employer]. He will be returning in December for both the START study and the Reprieve study.

## 2016-06-16 ENCOUNTER — Encounter (INDEPENDENT_AMBULATORY_CARE_PROVIDER_SITE_OTHER): Payer: Self-pay | Admitting: *Deleted

## 2016-06-16 ENCOUNTER — Encounter: Payer: Self-pay | Admitting: *Deleted

## 2016-06-16 VITALS — BP 136/98 | HR 92 | Temp 98.5°F | Wt 274.5 lb

## 2016-06-16 DIAGNOSIS — Z006 Encounter for examination for normal comparison and control in clinical research program: Secondary | ICD-10-CM

## 2016-06-16 DIAGNOSIS — Z23 Encounter for immunization: Secondary | ICD-10-CM

## 2016-06-16 LAB — LIPID PANEL
Cholesterol: 163 mg/dL (ref <200)
HDL: 25 mg/dL — AB (ref >40)
LDL CALC: 64 mg/dL (ref <100)
Total CHOL/HDL Ratio: 6.5 Ratio — ABNORMAL HIGH (ref <5.0)
Triglycerides: 372 mg/dL — ABNORMAL HIGH (ref <150)
VLDL: 74 mg/dL — AB (ref <30)

## 2016-06-16 LAB — COMPREHENSIVE METABOLIC PANEL
ALT: 220 U/L — ABNORMAL HIGH (ref 9–46)
AST: 107 U/L — ABNORMAL HIGH (ref 10–40)
Albumin: 3.8 g/dL (ref 3.6–5.1)
Alkaline Phosphatase: 102 U/L (ref 40–115)
BILIRUBIN TOTAL: 0.4 mg/dL (ref 0.2–1.2)
BUN: 9 mg/dL (ref 7–25)
CHLORIDE: 112 mmol/L — AB (ref 98–110)
CO2: 25 mmol/L (ref 20–31)
Calcium: 9.2 mg/dL (ref 8.6–10.3)
Creat: 0.82 mg/dL (ref 0.60–1.35)
GLUCOSE: 124 mg/dL — AB (ref 65–99)
Potassium: 4.4 mmol/L (ref 3.5–5.3)
SODIUM: 140 mmol/L (ref 135–146)
Total Protein: 7.1 g/dL (ref 6.1–8.1)

## 2016-06-16 NOTE — Progress Notes (Signed)
[id number] - Randomized Trial to prevent Vascular Events in HIV (REPRIEVE study).  Med: Pitavastatin/Placebo 4mg  PO Daily Duration: 3.5 - 6 years.  Timothy Perry is here for [id number] and START study. He c/o right thumb pain upon certain movements. This was from an injury in Sept. 2017 when he reach to block a soccer ball and the ball connected with his thumb. He did not seek medical attention and feels he may have fractured it or strained it given the fact it still hurts. He states he has trouble picking up objects as well as he had before although not enough to impact his ADLs. The second issue is elevated BP. He has not taken his Lisinopril since September. He has not had it filled due to cost. He is to see his PCP this afternoon and was told to discuss these 2 findings with her. I mentioned $4 [facility] list and that there may be something he can take off of that list but again needs to be discussed with PCP.  He has also been missing 1-2 doses of his Atripla every 2 weeks. I explained the concerns for resistance and scheduled him an appointment with Dr. Daiva EvesVan Dam in January to discuss switching to a more forgiving regimen. He is open to it and understands the importance.  Has one more semester of school and possibly moving to [place] in [place]. Fasting labs were obtained. Questionnaires completed. Study medication (Pitivastatin/Placebo) was dispensed. He received $70 gift card for both study visits. This was his last START study visit. His next A5332 visit will be in April. Tacey HeapElisha Aris Even RN

## 2016-06-17 LAB — URINALYSIS
Bilirubin Urine: NEGATIVE
Glucose, UA: NEGATIVE
Hgb urine dipstick: NEGATIVE
Ketones, ur: NEGATIVE
LEUKOCYTES UA: NEGATIVE
NITRITE: NEGATIVE
Protein, ur: NEGATIVE
Specific Gravity, Urine: 1.025 (ref 1.001–1.035)
pH: 5.5 (ref 5.0–8.0)

## 2016-06-18 LAB — CD4/CD8 (T-HELPER/T-SUPPRESSOR CELL)
CD4 Count: 961
CD4%: 53.4
CD8 % Suppressor T Cell: 29
CD8 T CELL ABS: 522

## 2016-06-21 LAB — HIV-1 RNA QUANT-NO REFLEX-BLD: HIV-1 RNA Quant, Log: <1.3 Log copies/mL (ref <1.30)

## 2016-06-28 ENCOUNTER — Encounter: Payer: Self-pay | Admitting: *Deleted

## 2016-07-25 ENCOUNTER — Ambulatory Visit: Payer: BLUE CROSS/BLUE SHIELD | Admitting: Infectious Disease

## 2016-10-06 ENCOUNTER — Encounter (INDEPENDENT_AMBULATORY_CARE_PROVIDER_SITE_OTHER): Payer: Self-pay | Admitting: *Deleted

## 2016-10-06 VITALS — BP 111/72 | HR 81 | Temp 97.8°F | Wt 265.8 lb

## 2016-10-06 DIAGNOSIS — Z006 Encounter for examination for normal comparison and control in clinical research program: Secondary | ICD-10-CM

## 2016-10-06 NOTE — Progress Notes (Signed)
Timothy Perry is here for his month 32 visit for Reprieve, A Randomized Trial to Prevent Vascular Events in HIV (study drug is Pitavastatin  or placebo).  He denies any new problems, except for noticing his heart racing at times. He said the last time it happenned was about 2 weeks ago and lasted for 3-5 min. No associated sx.  He attributes it to stress, he is working full time and going to school for counseling at [employer]. He is hoping to get into a 4 year program this Fall. He sometimes forgets to take his meds but it is due to hs erratic schedule. He missed his appointment with Dr. Daiva Eves in April so I rescheduled him for May. He is still on Atripla left over from the START study.

## 2016-11-22 ENCOUNTER — Ambulatory Visit (INDEPENDENT_AMBULATORY_CARE_PROVIDER_SITE_OTHER): Payer: BLUE CROSS/BLUE SHIELD | Admitting: Infectious Disease

## 2016-11-22 DIAGNOSIS — I10 Essential (primary) hypertension: Secondary | ICD-10-CM | POA: Diagnosis not present

## 2016-11-22 DIAGNOSIS — F419 Anxiety disorder, unspecified: Secondary | ICD-10-CM

## 2016-11-22 DIAGNOSIS — B20 Human immunodeficiency virus [HIV] disease: Secondary | ICD-10-CM | POA: Diagnosis not present

## 2016-11-22 DIAGNOSIS — F329 Major depressive disorder, single episode, unspecified: Secondary | ICD-10-CM | POA: Diagnosis not present

## 2016-11-22 LAB — COMPLETE METABOLIC PANEL WITH GFR
ALT: 134 U/L — ABNORMAL HIGH (ref 9–46)
AST: 77 U/L — ABNORMAL HIGH (ref 10–40)
Albumin: 4 g/dL (ref 3.6–5.1)
Alkaline Phosphatase: 98 U/L (ref 40–115)
BILIRUBIN TOTAL: 0.9 mg/dL (ref 0.2–1.2)
BUN: 9 mg/dL (ref 7–25)
CHLORIDE: 103 mmol/L (ref 98–110)
CO2: 30 mmol/L (ref 20–31)
Calcium: 9.2 mg/dL (ref 8.6–10.3)
Creat: 1.03 mg/dL (ref 0.60–1.35)
GFR, EST NON AFRICAN AMERICAN: 89 mL/min (ref >=60)
GFR, Est African American: >89 mL/min (ref >=60)
GLUCOSE: 124 mg/dL — AB (ref 65–99)
Potassium: 3.6 mmol/L (ref 3.5–5.3)
SODIUM: 139 mmol/L (ref 135–146)
TOTAL PROTEIN: 7.2 g/dL (ref 6.1–8.1)

## 2016-11-22 LAB — CBC WITH DIFFERENTIAL/PLATELET
BASOS ABS: 59 {cells}/uL (ref 0–200)
Basophils Relative: 1 %
EOS ABS: 177 {cells}/uL (ref 15–500)
Eosinophils Relative: 3 %
HCT: 42.5 % (ref 38.5–50.0)
HEMOGLOBIN: 14.3 g/dL (ref 13.2–17.1)
LYMPHS ABS: 2242 {cells}/uL (ref 850–3900)
Lymphocytes Relative: 38 %
MCH: 32.6 pg (ref 27.0–33.0)
MCHC: 33.6 g/dL (ref 32.0–36.0)
MCV: 96.8 fL (ref 80.0–100.0)
MPV: 9.4 fL (ref 7.5–12.5)
Monocytes Absolute: 354 cells/uL (ref 200–950)
Monocytes Relative: 6 %
NEUTROS ABS: 3068 {cells}/uL (ref 1500–7800)
Neutrophils Relative %: 52 %
Platelets: 209 10*3/uL (ref 140–400)
RBC: 4.39 MIL/uL (ref 4.20–5.80)
RDW: 12.4 % (ref 11.0–15.0)
WBC: 5.9 10*3/uL (ref 3.8–10.8)

## 2016-11-22 MED ORDER — MENINGOCOCCAL A C Y&W-135 OLIG IM SOLR
0.5000 mL | Freq: Once | INTRAMUSCULAR | Status: AC
  Administered 2016-11-22: 0.5 mL via INTRAMUSCULAR

## 2016-11-22 MED ORDER — BICTEGRAVIR-EMTRICITAB-TENOFOV 50-200-25 MG PO TABS: 1.0000 | ORAL_TABLET | Freq: Every day | ORAL | 11 refills | Status: DC

## 2016-11-22 MED FILL — BIKTARVY 50-200-25 MG TABS: 50-200-25 | 30 days supply | Qty: 30 | Fill #0

## 2016-11-22 NOTE — Patient Instructions (Signed)
[facility] in next 2-4 weeks and then at that appt make appt with me in next 3-4 months

## 2016-11-22 NOTE — Progress Notes (Signed)
Subjective:   Chief complaint: anxiety, depression and stress at going through divorce   Patient ID: Timothy Perry, male    DOB: [birth year], 41 y.o.   MRN: [id number]  HPI  41 year old man originally enrolled into the START trial and on Atripla now presenting for followup.  I recommended he be changed to a more "kidney and bone friendly" regimen and specifically suggested BIKTARVY which turns out will be covered by his insurance and we can also fill at [facility].  He is in need of meningococcal vaccine. He is in the REPRIEVE study.  Lab Results  Component Value Date   HIV1RNAQUANT <20 06/16/2016   HIV1RNAQUANT <20 10/13/2015   HIV1RNAQUANT <20 04/16/2015    Lab Results  Component Value Date   CD4TABS 1,361 10/13/2015   CD4TABS 963 04/16/2015   CD4TABS 984 10/16/2014   Past Medical History:  Diagnosis Date  . HIV (human immunodeficiency virus infection) (HCC) 03/26/2009   [facility];  participant in START Study  . Priapism    occasional, mornings  . Substance abuse     No past surgical history on file.  Family History  Problem Relation Age of Onset  . Thyroid disease Mother   . Asthma Brother       Social History   Social History  . Marital status: Single    Spouse name: [name]  . Number of children: 0  . Years of education: 15   Occupational History  . Education administrator    Social History Main Topics  . Smoking status: Current Every Day Smoker    Packs/day: 0.20    Years: 22.00    Types: Cigarettes  . Smokeless tobacco: Never Used     Comment: electronic cig  . Alcohol use 0.0 - 1.0 oz/week    0 - 2 drink(s) per week     Comment: social  . Drug use: No  . Sexual activity: Yes    Partners: Male     Comment: inconsistent condom use   Other Topics Concern  . Not on file   Social History Narrative   Engaged.  Just bought a house together.    No Known Allergies   Current Outpatient Prescriptions:  .  bictegravir-emtricitabine-tenofovir AF  (BIKTARVY) 50-200-25 MG TABS tablet, Take 1 tablet by mouth daily., Disp: 30 tablet, Rfl: 11 .  cyclobenzaprine (FLEXERIL) 10 MG tablet, Take 1 tablet (10 mg total) by mouth 3 (three) times daily as needed for muscle spasms., Disp: 30 tablet, Rfl: 0 .  lisinopril (PRINIVIL,ZESTRIL) 10 MG tablet, , Disp: , Rfl:  .  meloxicam (MOBIC) 15 MG tablet, Take 1 tablet (15 mg total) by mouth daily., Disp: 30 tablet, Rfl: 1 .  Naproxen Sodium (ALEVE) 220 MG CAPS, Take by mouth.  , Disp: , Rfl:  .  Pitavastatin Calcium 4 MG TABS, Take 1 tablet (4 mg total) by mouth daily., Disp: 30 tablet, Rfl: 11 .  valACYclovir (VALTREX) 1000 MG tablet, TAKE 1 TABLET (1,000 MG TOTAL) BY MOUTH 2 (TWO) TIMES DAILY., Disp: 20 tablet, Rfl: 0  Current Facility-Administered Medications:  .  methylPREDNISolone acetate (DEPO-MEDROL) injection 80 mg, 80 mg, Intramuscular, Once, Carmelina Dane, MD    Review of Systems  Constitutional: Negative for chills and fever.  HENT: Negative for congestion and sore throat.   Eyes: Negative for photophobia.  Respiratory: Negative for cough, shortness of breath and wheezing.   Cardiovascular: Negative for chest pain, palpitations and leg swelling.  Gastrointestinal: Negative for  abdominal pain, blood in stool, constipation, diarrhea, nausea and vomiting.  Genitourinary: Negative for dysuria, flank pain and hematuria.  Musculoskeletal: Negative for back pain and myalgias.  Skin: Negative for rash.  Neurological: Negative for dizziness, weakness and headaches.  Hematological: Does not bruise/bleed easily.  Psychiatric/Behavioral: Positive for sleep disturbance. Negative for suicidal ideas. The patient is nervous/anxious.        Objective:   Physical Exam  Constitutional: He is oriented to person, place, and time. He appears well-developed and well-nourished.  HENT:  Head: Normocephalic and atraumatic.  Eyes: Conjunctivae and EOM are normal.  Neck: Normal range of motion. Neck  supple.  Cardiovascular: Normal rate and regular rhythm.   Pulmonary/Chest: Effort normal. No respiratory distress. He has no wheezes.  Abdominal: Soft. He exhibits no distension.  Musculoskeletal: Normal range of motion. He exhibits no edema or tenderness.  Neurological: He is alert and oriented to person, place, and time.  Skin: Skin is warm and dry. No rash noted. No erythema. No pallor.  Psychiatric: He has a normal mood and affect. His speech is normal and behavior is normal. Thought content normal.          Assessment & Plan:   HIV disease: [facility] and followup with [facility] pharmacy in next 2-3 weeks and then with me in following 4 months  Anxiety and depression: can consider SSRI and also offer counseling at next appt.  HTN: continue ACEI  I spent greater than 45  minutes with the patient including greater than 50% of time in face to face counsel of the patient re his new ARV regimen, various other options and his anxiety stress re break up with his husband  and in coordination of his care.

## 2016-11-22 NOTE — Progress Notes (Signed)
HPI: Timothy Perry is a 41 y.o. male who is here to follow for his HIV  Allergies: No Known Allergies  Vitals:    Past Medical History: Past Medical History:  Diagnosis Date  . HIV (human immunodeficiency virus infection) (HCC) 03/26/2009   [facility];  participant in START Study  . Priapism    occasional, mornings  . Substance abuse     Social History: Social History   Social History  . Marital status: Single    Spouse name: [name]  . Number of children: 0  . 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 Smoking status: Current Every Day Smoker    Packs/day: 0.20    Years: 22.00    Types: Cigarettes  . Smokeless tobacco: Never Used     Comment: electronic cig  . Alcohol use 0.0 - 1.0 oz/week    0 - 2 drink(s) per week     Comment: social  . Drug use: No  . Sexual activity: Yes    Partners: Male     Comment: inconsistent condom use   Other Topics Concern  . Not on file   Social History Narrative   Engaged.  Just bought a house together.    Previous Regimen: None  Current Regimen: ATP  Labs: HIV 1 RNA Quant (copies/mL)  Date Value  06/16/2016 <20  10/13/2015 <20  04/16/2015 <20   CD4 (no units)  Date Value  10/13/2015 1,361  04/16/2015 963  10/16/2014 984   Hep B S Ab (no units)  Date Value  10/19/2009 NEG   Hepatitis B Surface Ag (no units)  Date Value  08/04/2015 NEGATIVE   HCV Ab (no units)  Date Value  08/04/2015 NEGATIVE    CrCl: CrCl cannot be calculated (Patient's most recent lab result is older than the maximum 21 days allowed.).  Lipids:    Component Value Date/Time   CHOL 163 06/16/2016 1000   TRIG 372 (H) 06/16/2016 1000   HDL 25 (L) 06/16/2016 1000   CHOLHDL 6.5 (H) 06/16/2016 1000   VLDL 74 (H) 06/16/2016 1000   LDLCALC 64 06/16/2016 1000    Assessment: Timothy Perry is here for his routine HIV visit. He is doing very well on ATP. He was in the START study and is  still taking ATP. He probably still got some supply from the study. We are going to change him to [place] today. [place] BCBS so [facility]. His copay is already taken care of. He is going to pick up at the pharmacy today.   Recommendations:  Change ATP to Biktarvy 1 PO qday Copay card activated  Ulyses [facility]wardMinh Derrian Rodak, PharmD, BCPS, AAHIVP, CPP Clinical Infectious Disease Pharmacist [facility] 11/22/2016, 11:23 AM

## 2016-11-23 LAB — RPR

## 2016-11-23 LAB — T-HELPER CELL (CD4) - (RCID CLINIC ONLY)
CD4 T CELL HELPER: 49 % (ref 33–55)
CD4 T Cell Abs: 1210 /uL (ref 400–2700)

## 2016-11-25 LAB — HIV-1 RNA QUANT-NO REFLEX-BLD
HIV 1 RNA Quant: <20 copies/mL
HIV-1 RNA Quant, Log: <1.3 Log copies/mL

## 2016-12-13 ENCOUNTER — Ambulatory Visit: Payer: BLUE CROSS/BLUE SHIELD

## 2016-12-15 ENCOUNTER — Ambulatory Visit (INDEPENDENT_AMBULATORY_CARE_PROVIDER_SITE_OTHER): Payer: BLUE CROSS/BLUE SHIELD | Admitting: Pharmacist

## 2016-12-15 DIAGNOSIS — B20 Human immunodeficiency virus [HIV] disease: Secondary | ICD-10-CM

## 2016-12-15 NOTE — Progress Notes (Signed)
HPI: Timothy Perry is a 41 y.o. male who presents to the [facility] clinic for follow-up of his HIV infection.   Allergies: No Known Allergies  Past Medical History: Past Medical History:  Diagnosis Date  . HIV (human immunodeficiency virus infection) (HCC) 03/26/2009   [facility];  participant in START Study  . Priapism    occasional, mornings  . Substance abuse     Social History: Social History   Social History  . Marital status: Single    Spouse name: [name]  . Number of children: 0  . 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 Smoking status: Current Every Day Smoker    Packs/day: 0.20    Years: 22.00    Types: Cigarettes  . Smokeless tobacco: Never Used     Comment: electronic cig  . Alcohol use 0.0 - 1.0 oz/week    0 - 2 drink(s) per week     Comment: social  . Drug use: No  . Sexual activity: Yes    Partners: Male     Comment: inconsistent condom use   Other Topics Concern  . Not on file   Social History Narrative   Engaged.  Just bought a house together.    Current Regimen: Biktarvy  Labs: HIV 1 RNA Quant (copies/mL)  Date Value  11/22/2016 <20 NOT DETECTED  06/16/2016 <20  10/13/2015 <20   CD4 (no units)  Date Value  10/13/2015 1,361  04/16/2015 963  10/16/2014 984   CD4 T Cell Abs (/uL)  Date Value  11/22/2016 1,210   Hep B S Ab (no units)  Date Value  10/19/2009 NEG   Hepatitis B Surface Ag (no units)  Date Value  08/04/2015 NEGATIVE   HCV Ab (no units)  Date Value  08/04/2015 NEGATIVE    CrCl: CrCl cannot be calculated (Patient's most recent lab result is older than the maximum 21 days allowed.).  Lipids:    Component Value Date/Time   CHOL 163 06/16/2016 1000   TRIG 372 (H) 06/16/2016 1000   HDL 25 (L) 06/16/2016 1000   CHOLHDL 6.5 (H) 06/16/2016 1000   VLDL 74 (H) 06/16/2016 1000   LDLCALC 64 06/16/2016 1000    Assessment: Timothy Perry is here today for HIV  follow-up. He recently switched from Atripla to [facility] by Dr. Daiva EvesVan Dam on 5/30.  He has been taking Biktarvy for ~3 weeks now. He is not having any issues specifically. He states he may have had a couple headaches the first weeks or so of starting, but nothing to really complain about.  No nausea, vomiting, or diarrhea.  He likes taking the new medication. He states he has missed 1 dose since starting [place] because he stayed overnight in [place] with his brother and forgot to take it with him.  I gave him a pill box keychain to prevent this from happening in the future.  He was very thankful and excited.  He states his depression/anxiety is getting much better since the last time he saw Dr. Daiva EvesVan Dam.  His father recently passed away and states they made amends before he passed so it has been a weight lifted off his shoulders.  I will get labs today.    Plans: - Continue [facility]tar[facility] daily [facility] - F/u with Dr. Daiva EvesVan Dam 9/5 at 10am  Dela Sweeny L. Matisse Salais, PharmD, CPP Infectious Diseases Clinical Pharmacist [facility] 12/15/2016,  1:06 PM

## 2016-12-19 LAB — HIV-1 RNA QUANT-NO REFLEX-BLD
HIV 1 RNA Quant: <20 copies/mL
HIV-1 RNA Quant, Log: <1.3 Log copies/mL

## 2016-12-21 MED FILL — BIKTARVY 50-200-25 MG TABS: 50-200-25 | 30 days supply | Qty: 30 | Fill #1

## 2017-01-16 MED FILL — BIKTARVY 50-200-25 MG TABS: 50-200-25 | 30 days supply | Qty: 30 | Fill #2

## 2017-01-26 ENCOUNTER — Encounter (INDEPENDENT_AMBULATORY_CARE_PROVIDER_SITE_OTHER): Payer: Self-pay | Admitting: *Deleted

## 2017-01-26 VITALS — BP 135/86 | HR 101 | Temp 98.0°F | Wt 264.8 lb

## 2017-01-26 DIAGNOSIS — Z006 Encounter for examination for normal comparison and control in clinical research program: Secondary | ICD-10-CM

## 2017-01-26 NOTE — Progress Notes (Signed)
Timothy Perry is here for his month 32 study visit for Reprieve, A Randomized Trial to Prevent Vascular Events in HIV (study drug is Pitavastatin 4mg  or placebo).  He had switched to [facility] the first of June and is doing fine. He says his adherence to the pitavastatin/placebo is good, but he did miss last night. He has chronic back pain and did have a bout of acute depression recently when his father died, but he says that is better. We discussed retention in Reprieve and the importance of staying on study. 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 Next appt. Is in December.

## 2017-02-08 MED FILL — BIKTARVY 50-200-25 MG TABS: 50-200-25 | 30 days supply | Qty: 30 | Fill #3

## 2017-02-28 ENCOUNTER — Ambulatory Visit: Payer: BLUE CROSS/BLUE SHIELD | Admitting: Infectious Disease

## 2017-03-12 ENCOUNTER — Ambulatory Visit: Payer: BLUE CROSS/BLUE SHIELD | Admitting: Infectious Disease

## 2017-03-12 MED FILL — BIKTARVY 50-200-25 MG TABS: 50-200-25 | 30 days supply | Qty: 30 | Fill #4

## 2017-04-17 ENCOUNTER — Telehealth: Payer: Self-pay | Admitting: Pharmacy Technician

## 2017-04-17 NOTE — Telephone Encounter (Signed)
[facility] reached out.  It is time for a refill of Biktarvy.  Made multiple calls, no voicemail, no one answered.  Sent an email today.

## 2017-04-30 MED FILL — BIKTARVY 50-200-25 MG TABS: 50-200-25 | 30 days supply | Qty: 30 | Fill #5

## 2017-05-23 MED FILL — BIKTARVY 50-200-25 MG TABS: 50-200-25 | 30 days supply | Qty: 30 | Fill #6

## 2017-06-01 ENCOUNTER — Encounter (INDEPENDENT_AMBULATORY_CARE_PROVIDER_SITE_OTHER): Payer: BLUE CROSS/BLUE SHIELD | Admitting: *Deleted

## 2017-06-01 VITALS — BP 129/93 | HR 90 | Temp 98.2°F | Wt 258.0 lb

## 2017-06-01 DIAGNOSIS — Z006 Encounter for examination for normal comparison and control in clinical research program: Secondary | ICD-10-CM

## 2017-06-01 NOTE — Progress Notes (Signed)
[id number] - Randomized Trial to prevent Vascular Events in HIV (REPRIEVE study).  Med: Pitavastatin/Placebo 4mg  PO Daily Duration: 3.5 - 6 years.  Timothy Perry is here for month 36. Fasting labs were drawn. He denies any new issues. Questionnaires completed. He received $75 gift card for visit and travel. He plans on moving to [place] where he will be closer to family and potential job opportunities. Possibly transfer to close Reprieve site.

## 2017-06-11 ENCOUNTER — Other Ambulatory Visit: Payer: Self-pay | Admitting: Pharmacist

## 2017-06-11 DIAGNOSIS — I10 Essential (primary) hypertension: Secondary | ICD-10-CM

## 2017-06-11 MED ORDER — LISINOPRIL 10 MG PO TABS: 10.0000 mg | ORAL_TABLET | Freq: Every day | ORAL | 5 refills | Status: DC

## 2017-06-21 MED FILL — LISINOPRIL 10 MG TABS: 10 | 30 days supply | Qty: 30 | Fill #0

## 2017-06-21 MED FILL — BIKTARVY 50-200-25 MG TABS: 50-200-25 | 30 days supply | Qty: 30 | Fill #7

## 2017-07-20 MED FILL — LISINOPRIL 10 MG TABS: 10 | 30 days supply | Qty: 30 | Fill #1

## 2017-07-20 MED FILL — BIKTARVY 50-200-25 MG TABS: 50-200-25 | 30 days supply | Qty: 30 | Fill #8

## 2017-08-21 MED FILL — LISINOPRIL 10 MG TABS: 10 | 30 days supply | Qty: 30 | Fill #2

## 2017-08-21 MED FILL — BIKTARVY 50-200-25 MG TABS: 50-200-25 | 30 days supply | Qty: 30 | Fill #9

## 2017-09-13 ENCOUNTER — Other Ambulatory Visit: Payer: Self-pay

## 2017-09-13 ENCOUNTER — Telehealth: Payer: Self-pay | Admitting: Pharmacist Clinician (PhC)/ Clinical Pharmacy Specialist

## 2017-09-13 MED FILL — BIKTARVY 50-200-25 MG TABS: 50-200-25 | 30 days supply | Qty: 30 | Fill #10

## 2017-09-13 MED FILL — LISINOPRIL 10 MG TABS: 10 | 30 days supply | Qty: 30 | Fill #3

## 2017-09-13 NOTE — Telephone Encounter (Signed)
Tod called us to let us know that he will be leaving his job at the end of this month and will not have insurance then. He was asking about coverage for meds. Told him that we can do ADAP but he is moving to [place] (likely[place] early April. He won't have f/u with Dr. Daiva EvesVan Dam until the 4/24. What we can do is applying him for Advancing Access so that he can bridge until he establishes at a clinic up there. He will call back for an appt with pharmacy once everything is set. He will bring paystubs, insurance end date letter.

## 2017-09-13 NOTE — Telephone Encounter (Signed)
Lets def do Adv access to get him meds in meantime. Thanks so much [facility]!

## 2017-10-04 ENCOUNTER — Encounter (INDEPENDENT_AMBULATORY_CARE_PROVIDER_SITE_OTHER): Payer: BLUE CROSS/BLUE SHIELD | Admitting: *Deleted

## 2017-10-04 VITALS — BP 125/79 | HR 98 | Temp 98.3°F | Wt 268.5 lb

## 2017-10-04 DIAGNOSIS — Z006 Encounter for examination for normal comparison and control in clinical research program: Secondary | ICD-10-CM

## 2017-10-04 NOTE — Progress Notes (Signed)
Timothy Perry is here for his [address] visit for Reprieve. He says his adherence to study med and his ARVs is very good. He is in the process of moving out of his house and moving in with his cousin in [place]. He just passed the boards for substance abuse counseling and is very excited about moving on with his life after his divorce. He denies any new problems or medications. He will return for study in August and to see Dr. Daiva EvesVan Dam in a few weeks.

## 2017-10-05 ENCOUNTER — Encounter: Payer: Self-pay | Admitting: Infectious Disease

## 2017-10-10 ENCOUNTER — Other Ambulatory Visit: Payer: BLUE CROSS/BLUE SHIELD

## 2017-10-10 ENCOUNTER — Other Ambulatory Visit: Payer: Self-pay | Admitting: *Deleted

## 2017-10-10 DIAGNOSIS — B20 Human immunodeficiency virus [HIV] disease: Secondary | ICD-10-CM

## 2017-10-10 LAB — COMPLETE METABOLIC PANEL WITH GFR
AG RATIO: 1.6 (calc) (ref 1.0–2.5)
ALT: 71 U/L — AB (ref 9–46)
AST: 45 U/L — AB (ref 10–40)
Albumin: 4.3 g/dL (ref 3.6–5.1)
Alkaline phosphatase (APISO): 72 U/L (ref 40–115)
BILIRUBIN TOTAL: 1 mg/dL (ref 0.2–1.2)
BUN: 11 mg/dL (ref 7–25)
CO2: 26 mmol/L (ref 20–32)
Calcium: 9.1 mg/dL (ref 8.6–10.3)
Chloride: 106 mmol/L (ref 98–110)
Creat: 1.04 mg/dL (ref 0.60–1.35)
GFR, EST AFRICAN AMERICAN: 101 mL/min/{1.73_m2} (ref >=60)
GFR, Est Non African American: 88 mL/min/{1.73_m2} (ref >=60)
Globulin: 2.7 g/dL (calc) (ref 1.9–3.7)
Glucose, Bld: 124 mg/dL — ABNORMAL HIGH (ref 65–99)
POTASSIUM: 4.3 mmol/L (ref 3.5–5.3)
SODIUM: 139 mmol/L (ref 135–146)
Total Protein: 7 g/dL (ref 6.1–8.1)

## 2017-10-10 LAB — CBC WITH DIFFERENTIAL/PLATELET
BASOS ABS: 53 {cells}/uL (ref 0–200)
Basophils Relative: 0.8 %
EOS PCT: 2.7 %
Eosinophils Absolute: 178 cells/uL (ref 15–500)
HCT: 35.9 % — ABNORMAL LOW (ref 38.5–50.0)
HEMOGLOBIN: 12.7 g/dL — AB (ref 13.2–17.1)
Lymphs Abs: 2211 cells/uL (ref 850–3900)
MCH: 33.9 pg — ABNORMAL HIGH (ref 27.0–33.0)
MCHC: 35.4 g/dL (ref 32.0–36.0)
MCV: 95.7 fL (ref 80.0–100.0)
MONOS PCT: 6.2 %
MPV: 10.3 fL (ref 7.5–12.5)
NEUTROS ABS: 3749 {cells}/uL (ref 1500–7800)
Neutrophils Relative %: 56.8 %
PLATELETS: 203 10*3/uL (ref 140–400)
RBC: 3.75 10*6/uL — ABNORMAL LOW (ref 4.20–5.80)
RDW: 13.3 % (ref 11.0–15.0)
TOTAL LYMPHOCYTE: 33.5 %
WBC mixed population: 409 cells/uL (ref 200–950)
WBC: 6.6 10*3/uL (ref 3.8–10.8)

## 2017-10-11 LAB — T-HELPER CELL (CD4) - (RCID CLINIC ONLY)
CD4 % Helper T Cell: 49 % (ref 33–55)
CD4 T Cell Abs: 1030 /uL (ref 400–2700)

## 2017-10-12 LAB — HIV-1 RNA QUANT-NO REFLEX-BLD
HIV 1 RNA QUANT: NOT DETECTED {copies}/mL
HIV-1 RNA Quant, Log: <1.3 Log copies/mL

## 2017-10-17 ENCOUNTER — Ambulatory Visit: Payer: BLUE CROSS/BLUE SHIELD | Admitting: Infectious Disease

## 2017-10-17 MED FILL — BIKTARVY 50-200-25 MG TABS: 50-200-25 | 30 days supply | Qty: 30 | Fill #11

## 2017-10-17 MED FILL — LISINOPRIL 10 MG TABS: 10 | 30 days supply | Qty: 30 | Fill #4

## 2017-10-18 ENCOUNTER — Ambulatory Visit: Payer: BLUE CROSS/BLUE SHIELD | Admitting: Infectious Disease

## 2017-10-18 ENCOUNTER — Encounter: Payer: Self-pay | Admitting: Infectious Disease

## 2017-10-18 VITALS — BP 129/77 | HR 102 | Temp 98.6°F | Ht 71.0 in | Wt 270.0 lb

## 2017-10-18 DIAGNOSIS — D649 Anemia, unspecified: Secondary | ICD-10-CM

## 2017-10-18 DIAGNOSIS — Z23 Encounter for immunization: Secondary | ICD-10-CM | POA: Diagnosis not present

## 2017-10-18 DIAGNOSIS — B20 Human immunodeficiency virus [HIV] disease: Secondary | ICD-10-CM | POA: Diagnosis not present

## 2017-10-18 DIAGNOSIS — F419 Anxiety disorder, unspecified: Secondary | ICD-10-CM

## 2017-10-18 DIAGNOSIS — F329 Major depressive disorder, single episode, unspecified: Secondary | ICD-10-CM

## 2017-10-18 NOTE — Progress Notes (Signed)
Subjective:   Chief complaint: He still suffers from some depressive symptoms related to his divorce.  He also recently lost his job and will need to go on to the ADAP program soon.  He also was concerned about anemia that showed up on labs most recently.   Patient ID: Timothy Perry, male    DOB: 1976/05/25, 42 y.o.   MRN: [id number]  HPI  42 year old man originally enrolled into the START trial and on Atripla now presenting for followup.  I recommended he be changed to a more "kidney and bone friendly" regimen and specifically suggested BIKTARVY.  Is been taking this for some time now and is remained virologically suppressed.  Lab Results  Component Value Date   HIV1RNAQUANT <20 NOT DETECTED 10/10/2017   HIV1RNAQUANT <20 NOT DETECTED 12/15/2016   HIV1RNAQUANT <20 NOT DETECTED 11/22/2016    Lab Results  Component Value Date   CD4TABS 1,030 10/10/2017   CD4TABS 1,210 11/22/2016   CD4TABS 1,361 10/13/2015   Past Medical History:  Diagnosis Date  . HIV (human immunodeficiency virus infection) (HCC) 03/26/2009   [facility];  participant in START Study  . Priapism    occasional, mornings  . Substance abuse (HCC)     No past surgical history on file.  Family History  Problem Relation Age of Onset  . Thyroid disease Mother   . Asthma Brother       Social History   Socioeconomic History  . Marital status: Single    Spouse name: [name]  . Number of children: 0  . Years of education: 2515  . Highest education level: Not on file  Occupational History  . Occupation: Education administratorcabinet factory  Social Needs  . Financial resource strain: Not on file  . Food insecurity:    Worry: Not on file    Inability: Not on file  . Transportation needs:    Medical: Not on file    Non-medical: Not on file  Tobacco Use  . Smoking status: Current Every Day Smoker    Packs/day: 0.20    Years: 22.00    Pack years: 4.40    Types: Cigarettes  . Smokeless tobacco: Never Used  . Tobacco comment:  electronic cig  Substance and Sexual Activity  . Alcohol use: Yes    Alcohol/week: 0.0 - 1.0 oz    Comment: social  . Drug use: No  . Sexual activity: Yes    Partners: Male    Comment: inconsistent condom use  Lifestyle  . Physical activity:    Days per week: Not on file    Minutes per session: Not on file  . Stress: Not on file  Relationships  . Social connections:    Talks on phone: Not on file    Gets together: Not on file    Attends religious service: Not on file    Active member of club or organization: Not on file    Attends meetings of clubs or organizations: Not on file    Relationship status: Not on file  Other Topics Concern  . Not on file  Social History Narrative   Engaged.  Just bought a house together.    No Known Allergies   Current Outpatient Medications:  .  bictegravir-emtricitabine-tenofovir AF (BIKTARVY) 50-200-25 MG TABS tablet, Take 1 tablet by mouth daily., Disp: 30 tablet, Rfl: 11 .  cyclobenzaprine (FLEXERIL) 10 MG tablet, Take 1 tablet (10 mg total) by mouth 3 (three) times daily as needed for muscle spasms., Disp:  30 tablet, Rfl: 0 .  lisinopril (PRINIVIL,ZESTRIL) 10 MG tablet, Take 1 tablet (10 mg total) by mouth daily., Disp: 30 tablet, Rfl: 5 .  meloxicam (MOBIC) 15 MG tablet, Take 1 tablet (15 mg total) by mouth daily., Disp: 30 tablet, Rfl: 1 .  Naproxen Sodium (ALEVE) 220 MG CAPS, Take by mouth.  , Disp: , Rfl:  .  Pitavastatin Calcium 4 MG TABS, Take 1 tablet (4 mg total) by mouth daily., Disp: 30 tablet, Rfl: 11 .  valACYclovir (VALTREX) 1000 MG tablet, TAKE 1 TABLET (1,000 MG TOTAL) BY MOUTH 2 (TWO) TIMES DAILY., Disp: 20 tablet, Rfl: 0  Current Facility-Administered Medications:  .  methylPREDNISolone acetate (DEPO-MEDROL) injection 80 mg, 80 mg, Intramuscular, Once, Carmelina Dane, MD    Review of Systems  Constitutional: Negative for chills and fever.  HENT: Negative for congestion and sore throat.   Eyes: Negative for  photophobia.  Respiratory: Negative for cough, shortness of breath and wheezing.   Cardiovascular: Negative for chest pain, palpitations and leg swelling.  Gastrointestinal: Negative for abdominal pain, blood in stool, constipation, diarrhea, nausea and vomiting.  Genitourinary: Negative for dysuria, flank pain and hematuria.  Musculoskeletal: Negative for back pain and myalgias.  Skin: Negative for rash.  Neurological: Negative for dizziness, weakness and headaches.  Hematological: Does not bruise/bleed easily.  Psychiatric/Behavioral: Positive for dysphoric mood. Negative for suicidal ideas.       Objective:   Physical Exam  Constitutional: He is oriented to person, place, and time. He appears well-developed and well-nourished.  HENT:  Head: Normocephalic and atraumatic.  Eyes: Conjunctivae and EOM are normal.  Neck: Normal range of motion. Neck supple.  Cardiovascular: Normal rate and regular rhythm.  Pulmonary/Chest: Effort normal. No respiratory distress. He has no wheezes.  Abdominal: Soft. He exhibits no distension.  Musculoskeletal: Normal range of motion. He exhibits no edema or tenderness.  Neurological: He is alert and oriented to person, place, and time.  Skin: Skin is warm and dry. No rash noted. No erythema. No pallor.  Psychiatric: He has a normal mood and affect. His speech is normal and behavior is normal. Thought content normal.          Assessment & Plan:   HIV disease: Continue BIKTARVY, bring him back in 2 months time to ensure that he is renewing ADAP once he applies for it.   Anxiety and depression: can consider SSRI and also offer counseling future  HTN: continue ACEI  Anemia: He denies any blood per rectum or dizziness or headaches or chest pain we will check TSH folate B12 and iron panel.

## 2017-10-19 LAB — IRON,TIBC AND FERRITIN PANEL
%SAT: 19 % (ref 15–60)
Ferritin: 306 ng/mL (ref 20–380)
Iron: 75 ug/dL (ref 50–180)
TIBC: 400 ug/dL (ref 250–425)

## 2017-10-19 LAB — B12 AND FOLATE PANEL
FOLATE: 17.4 ng/mL
VITAMIN B 12: 1063 pg/mL (ref 200–1100)

## 2017-10-19 LAB — TSH: TSH: 1.42 m[IU]/L (ref 0.40–4.50)

## 2017-11-05 ENCOUNTER — Other Ambulatory Visit: Payer: Self-pay | Admitting: *Deleted

## 2017-11-05 DIAGNOSIS — B20 Human immunodeficiency virus [HIV] disease: Secondary | ICD-10-CM

## 2017-11-05 MED ORDER — BICTEGRAVIR-EMTRICITAB-TENOFOV 50-200-25 MG PO TABS: 1.0000 | ORAL_TABLET | Freq: Every day | ORAL | 3 refills | Status: DC

## 2017-11-12 MED FILL — LISINOPRIL 10 MG TABS: 10 | 30 days supply | Qty: 30 | Fill #5

## 2018-01-01 ENCOUNTER — Other Ambulatory Visit: Payer: Self-pay | Admitting: Pharmacist

## 2018-01-01 DIAGNOSIS — I10 Essential (primary) hypertension: Secondary | ICD-10-CM

## 2018-01-02 MED FILL — LISINOPRIL 10 MG TABLET: 10 | 30 days supply | Qty: 30 | Fill #0

## 2018-01-03 ENCOUNTER — Other Ambulatory Visit: Payer: Self-pay

## 2018-01-03 DIAGNOSIS — F419 Anxiety disorder, unspecified: Secondary | ICD-10-CM

## 2018-01-03 DIAGNOSIS — F329 Major depressive disorder, single episode, unspecified: Secondary | ICD-10-CM

## 2018-01-03 DIAGNOSIS — B20 Human immunodeficiency virus [HIV] disease: Secondary | ICD-10-CM

## 2018-01-04 LAB — COMPLETE METABOLIC PANEL WITH GFR
AG RATIO: 1.6 (calc) (ref 1.0–2.5)
ALBUMIN MSPROF: 4.5 g/dL (ref 3.6–5.1)
ALT: 60 U/L — ABNORMAL HIGH (ref 9–46)
AST: 38 U/L (ref 10–40)
Alkaline phosphatase (APISO): 76 U/L (ref 40–115)
BILIRUBIN TOTAL: 0.7 mg/dL (ref 0.2–1.2)
BUN: 11 mg/dL (ref 7–25)
CHLORIDE: 100 mmol/L (ref 98–110)
CO2: 26 mmol/L (ref 20–32)
Calcium: 9.5 mg/dL (ref 8.6–10.3)
Creat: 1.08 mg/dL (ref 0.60–1.35)
GFR, EST AFRICAN AMERICAN: 96 mL/min/{1.73_m2} (ref >=60)
GFR, EST NON AFRICAN AMERICAN: 83 mL/min/{1.73_m2} (ref >=60)
GLUCOSE: 115 mg/dL — AB (ref 65–99)
Globulin: 2.9 g/dL (calc) (ref 1.9–3.7)
Potassium: 4 mmol/L (ref 3.5–5.3)
Sodium: 139 mmol/L (ref 135–146)
TOTAL PROTEIN: 7.4 g/dL (ref 6.1–8.1)

## 2018-01-04 LAB — CBC WITH DIFFERENTIAL/PLATELET
BASOS ABS: 28 {cells}/uL (ref 0–200)
Basophils Relative: 0.4 %
EOS ABS: 99 {cells}/uL (ref 15–500)
Eosinophils Relative: 1.4 %
HCT: 41.5 % (ref 38.5–50.0)
Hemoglobin: 14.7 g/dL (ref 13.2–17.1)
Lymphs Abs: 2357 cells/uL (ref 850–3900)
MCH: 33.3 pg — AB (ref 27.0–33.0)
MCHC: 35.4 g/dL (ref 32.0–36.0)
MCV: 93.9 fL (ref 80.0–100.0)
MPV: 10.4 fL (ref 7.5–12.5)
Monocytes Relative: 5.9 %
NEUTROS PCT: 59.1 %
Neutro Abs: 4196 cells/uL (ref 1500–7800)
PLATELETS: 191 10*3/uL (ref 140–400)
RBC: 4.42 10*6/uL (ref 4.20–5.80)
RDW: 11.7 % (ref 11.0–15.0)
Total Lymphocyte: 33.2 %
WBC: 7.1 10*3/uL (ref 3.8–10.8)
WBCMIX: 419 {cells}/uL (ref 200–950)

## 2018-01-04 LAB — URINE CYTOLOGY ANCILLARY ONLY
CHLAMYDIA, DNA PROBE: NEGATIVE
NEISSERIA GONORRHEA: NEGATIVE

## 2018-01-04 LAB — MICROALBUMIN / CREATININE URINE RATIO
Creatinine, Urine: 174 mg/dL (ref 20–320)
Microalb Creat Ratio: 2 mcg/mg creat (ref <30)
Microalb, Ur: 0.4 mg/dL

## 2018-01-04 LAB — LIPID PANEL
CHOLESTEROL: 175 mg/dL (ref <200)
HDL: 30 mg/dL — ABNORMAL LOW (ref >40)
LDL CHOLESTEROL (CALC): 96 mg/dL
Non-HDL Cholesterol (Calc): 145 mg/dL (calc) — ABNORMAL HIGH (ref <130)
TRIGLYCERIDES: 369 mg/dL — AB (ref <150)
Total CHOL/HDL Ratio: 5.8 (calc) — ABNORMAL HIGH (ref <5.0)

## 2018-01-04 LAB — RPR: RPR Ser Ql: NONREACTIVE

## 2018-01-04 LAB — T-HELPER CELL (CD4) - (RCID CLINIC ONLY)
CD4 % Helper T Cell: 29 % — ABNORMAL LOW (ref 33–55)
CD4 T Cell Abs: 800 /uL (ref 400–2700)

## 2018-01-06 LAB — HIV-1 RNA QUANT-NO REFLEX-BLD
HIV 1 RNA QUANT: NOT DETECTED {copies}/mL
HIV-1 RNA Quant, Log: <1.3 Log copies/mL

## 2018-01-17 ENCOUNTER — Ambulatory Visit: Payer: BLUE CROSS/BLUE SHIELD | Admitting: Infectious Disease

## 2018-01-17 ENCOUNTER — Ambulatory Visit: Payer: Self-pay

## 2018-01-17 ENCOUNTER — Ambulatory Visit (INDEPENDENT_AMBULATORY_CARE_PROVIDER_SITE_OTHER): Payer: Self-pay | Admitting: Family

## 2018-01-17 ENCOUNTER — Encounter: Payer: Self-pay | Admitting: Family

## 2018-01-17 VITALS — BP 134/90 | HR 92 | Temp 98.6°F | Ht 71.0 in | Wt 272.0 lb

## 2018-01-17 DIAGNOSIS — Z23 Encounter for immunization: Secondary | ICD-10-CM

## 2018-01-17 DIAGNOSIS — B20 Human immunodeficiency virus [HIV] disease: Secondary | ICD-10-CM

## 2018-01-17 MED ORDER — BICTEGRAVIR-EMTRICITAB-TENOFOV 50-200-25 MG PO TABS: 1.0000 | ORAL_TABLET | Freq: Every day | ORAL | 5 refills | Status: DC

## 2018-01-17 NOTE — Assessment & Plan Note (Signed)
[name] appears to have well controlled HIV with his current regimen with a viral load that is undetectable and CD4 count of 800. He is adherent with his medication and has no problems obtaining it. He has renewed his UMAP/HMAP today. He has no signs/symptoms of opportunistic infection or progression of HIV disease through history or physical exam. Pneumovax updated today. Continue current dosage of Biktarvy. Declines condoms. Plan for follow up in 6 months with lab work 1-2 weeks prior.

## 2018-01-17 NOTE — Progress Notes (Signed)
Subjective:    Patient ID: Timothy Perry, male    DOB: [birth year], 42 y.o.   MRN: [id number]  Chief Complaint  Patient presents with  . HIV Positive/AIDS     HPI:  Timothy Perry is a 42 y.o. male who presents today for routine follow-up visit of his HIV disease.  Timothy Perry was last seen in the office on 10/18/17 by Dr. Daiva Eves. At that time his viral load was undetectable with a CD4 count of 1030. He was continued on his Biktarvy. He most recently completed blood work on 01/03/18 with a viral load that continues to be undetectable and a CD4 count of 800. His RPR was non-reactive. Kidney function and electrolytes were normal. Liver function slightly elevated. LDL cholesterol was 96. He is due for Pneumovax today and is in need for UMAP renewal.   Timothy Perry reports taking his medications as prescribed with no adverse side effects. He has missed 1-2 doses of medication per month on average and believes this is related to his change of job schedules. He has no problems obtaining his medications and remains employed and is currently living with family with the goal of saving up for an apartment following his divorce. Mentally he is doing very well. Denies fevers, chills, night sweats, headaches, changes in vision, neck pain/stiffness, nausea, diarrhea, vomiting, lesions or rashes.  He has plans for visiting friends and family in [place].    No Known Allergies  Immunization History  Administered Date(s) Administered  . Hepatitis A 05/09/2010, 11/23/2010  . Hepatitis B 11/09/2009, 01/19/2010, 05/23/2010  . Influenza Split 04/05/2011, 05/03/2012  . Influenza Whole 02/16/2010  . Influenza, Seasonal, Injecte, Preservative Fre 03/28/2013  . Influenza,inj,Quad PF,6+ Mos 04/03/2014, 04/16/2015, 06/16/2016  . Influenza-Unspecified 03/28/2017  . Meningococcal Mcv4o 11/22/2016, 10/18/2017  . Pneumococcal Conjugate-13 10/18/2017  . Pneumococcal Polysaccharide-23 06/28/2009, 01/17/2018  . Td  06/28/2009     Outpatient Medications Prior to Visit  Medication Sig Dispense Refill  . cyclobenzaprine (FLEXERIL) 10 MG tablet Take 1 tablet (10 mg total) by mouth 3 (three) times daily as needed for muscle spasms. 30 tablet 0  . lisinopril (PRINIVIL,ZESTRIL) 10 MG tablet TAKE 1 TABLET (10 MG TOTAL) BY MOUTH DAILY. 30 tablet 5  . Naproxen Sodium (ALEVE) 220 MG CAPS Take by mouth.      . Pitavastatin Calcium 4 MG TABS Take 1 tablet (4 mg total) by mouth daily. 30 tablet 11  . valACYclovir (VALTREX) 1000 MG tablet TAKE 1 TABLET (1,000 MG TOTAL) BY MOUTH 2 (TWO) TIMES DAILY. 20 tablet 0  . bictegravir-emtricitabine-tenofovir AF (BIKTARVY) 50-200-25 MG TABS tablet Take 1 tablet by mouth daily. 30 tablet 3  . meloxicam (MOBIC) 15 MG tablet Take 1 tablet (15 mg total) by mouth daily. 30 tablet 1   Facility-Administered Medications Prior to Visit  Medication Dose Route Frequency Provider Last Rate Last Dose  . methylPREDNISolone acetate (DEPO-MEDROL) injection 80 mg  80 mg Intramuscular Once Carmelina Dane, MD         Past Medical History:  Diagnosis Date  . HIV (human immunodeficiency virus infection) (HCC) 03/26/2009   [facility];  participant in START Study  . Priapism    occasional, mornings  . Substance abuse (HCC)      History reviewed. No pertinent surgical history.   Review of Systems  Constitutional: Negative for appetite change, chills, fatigue, fever and unexpected weight change.  Eyes: Negative for visual disturbance.  Respiratory: Negative for cough, chest tightness, shortness of  breath and wheezing.   Cardiovascular: Negative for chest pain and leg swelling.  Gastrointestinal: Negative for abdominal pain, constipation, diarrhea, nausea and vomiting.  Genitourinary: Negative for dysuria, flank pain, frequency, genital sores, hematuria and urgency.  Skin: Negative for rash.  Allergic/Immunologic: Negative for immunocompromised state.  Neurological: Negative for  dizziness and headaches.      Objective:    BP 134/90   Pulse 92   Temp 98.6 F (37 C) (Oral)   Ht 5\' 11"  (1.803 m)   Wt 272 lb (123.4 kg)   BMI 37.94 kg/m  Nursing note and vital signs reviewed.  Physical Exam  Constitutional: He is oriented to person, place, and time. He appears well-developed. No distress.  HENT:  Mouth/Throat: Oropharynx is clear and moist.  Eyes: Conjunctivae are normal.  Neck: Neck supple.  Cardiovascular: Normal rate, regular rhythm, normal heart sounds and intact distal pulses. Exam reveals no gallop and no friction rub.  No murmur heard. Pulmonary/Chest: Effort normal and breath sounds normal. No respiratory distress. He has no wheezes. He has no rales. He exhibits no tenderness.  Abdominal: Soft. Bowel sounds are normal. There is no tenderness.  Lymphadenopathy:    He has no cervical adenopathy.  Neurological: He is alert and oriented to person, place, and time.  Skin: Skin is warm and dry. No rash noted.  Psychiatric: He has a normal mood and affect. His behavior is normal. Judgment and thought content normal.       Assessment & Plan:   Problem List Items Addressed This Visit      Other   HIV disease (HCC) - Primary (Chronic)    [name] appears to have well controlled HIV with his current regimen with a viral load that is undetectable and CD4 count of 800. He is adherent with his medication and has no problems obtaining it. He has renewed his UMAP/HMAP today. He has no signs/symptoms of opportunistic infection or progression of HIV disease through history or physical exam. Pneumovax updated today. Continue current dosage of Biktarvy. Declines condoms. Plan for follow up in 6 months with lab work 1-2 weeks prior.       Relevant Medications   bictegravir-emtricitabine-tenofovir AF (BIKTARVY) 50-200-25 MG TABS tablet   Other Relevant Orders   Pneumococcal polysaccharide vaccine 23-valent greater than or equal to 2yo subcutaneous/IM (Completed)     T-helper cell (CD4)- ([facility] clinic only)   HIV 1 RNA quant-no reflex-bld   CBC   RPR   Comprehensive metabolic panel    Other Visit Diagnoses    Need for pneumococcal vaccination       Relevant Orders   Pneumococcal polysaccharide vaccine 23-valent greater than or equal to 2yo subcutaneous/IM (Completed)       I have discontinued Faustino CongressStephen T. Zynda "Tod"'s meloxicam. I am also having him maintain his Naproxen Sodium, valACYclovir, cyclobenzaprine, Pitavastatin Calcium, lisinopril, and bictegravir-emtricitabine-tenofovir AF. We will continue to administer methylPREDNISolone acetate.   Meds ordered this encounter  Medications  . bictegravir-emtricitabine-tenofovir AF (BIKTARVY) 50-200-25 MG TABS tablet    Sig: Take 1 tablet by mouth daily.    Dispense:  30 tablet    Refill:  5    Order Specific Question:   Supervising Provider    Answer:   Judyann MunsonSNIDER, CYNTHIA [[id number]]     Follow-up: Return in about 6 months (around 07/20/2018), or if symptoms worsen or fail to improve.   Marcos EkeGreg Rakeen Gaillard, MSN, [facility] CenterFNP-C [facility]

## 2018-01-17 NOTE — Patient Instructions (Signed)
Nice to meet you.   Please continue to take your medication as prescribed.   You have updated your Pneumonia vaccination (Pneumovax) today.  Plan for follow up in 6 months with lab work 1-2 works prior.   Schedule a time for your flu shot after September.

## 2018-01-21 ENCOUNTER — Encounter: Payer: Self-pay | Admitting: Infectious Disease

## 2018-02-07 ENCOUNTER — Encounter (INDEPENDENT_AMBULATORY_CARE_PROVIDER_SITE_OTHER): Payer: Self-pay | Admitting: *Deleted

## 2018-02-07 VITALS — BP 116/79 | HR 93 | Temp 98.0°F | Wt 279.5 lb

## 2018-02-07 DIAGNOSIS — Z006 Encounter for examination for normal comparison and control in clinical research program: Secondary | ICD-10-CM

## 2018-02-07 NOTE — Progress Notes (Signed)
Timothy Perry is here for his month 44 visit for Repreive. He denies any new problems or concerns. 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 He will be returning in 4 months  For the next research visit.

## 2018-03-05 MED FILL — LISINOPRIL 10 MG TABLET: 10 | 30 days supply | Qty: 30 | Fill #1

## 2018-05-13 ENCOUNTER — Encounter (INDEPENDENT_AMBULATORY_CARE_PROVIDER_SITE_OTHER): Payer: Self-pay | Admitting: *Deleted

## 2018-05-13 ENCOUNTER — Other Ambulatory Visit: Payer: Self-pay | Admitting: *Deleted

## 2018-05-13 VITALS — BP 132/84 | HR 87 | Temp 97.4°F | Wt 294.0 lb

## 2018-05-13 DIAGNOSIS — B20 Human immunodeficiency virus [HIV] disease: Secondary | ICD-10-CM

## 2018-05-13 DIAGNOSIS — Z006 Encounter for examination for normal comparison and control in clinical research program: Secondary | ICD-10-CM

## 2018-05-13 MED ORDER — BICTEGRAVIR-EMTRICITAB-TENOFOV 50-200-25 MG PO TABS: 1.0000 | ORAL_TABLET | Freq: Every day | ORAL | 5 refills | Status: DC

## 2018-05-13 MED ORDER — STUDY - REPRIEVE A5332 - PITAVASTATIN 4MG OR PLACEBO TABLET (PI-VAN DAM): 4.0000 mg | ORAL_TABLET | Freq: Every day | ORAL | Status: DC

## 2018-05-13 MED FILL — BIKTARVY 50-200-25 MG TABS: 50-200-25 | 30 days supply | Qty: 30 | Fill #0

## 2018-05-13 MED FILL — LISINOPRIL 10 MG TABS: 10 | 30 days supply | Qty: 30 | Fill #2

## 2018-05-13 NOTE — Progress Notes (Signed)
Timothy Perry is here for his month 48 visit for Reprieve, A Randomized Trial to Prevent Vascular Events in HIV (study drug is Pitavastatin 4mg  or placebo)  He denies any new problems, except for stress from work. He has not been working out or taking his meds like he is supposed to, but will try to get back on target. He denies any muscle aches or weakness. His next study visit will be in March

## 2018-06-14 MED FILL — BIKTARVY 50-200-25 MG TABS: 50-200-25 | 30 days supply | Qty: 30 | Fill #1

## 2018-06-14 MED FILL — LISINOPRIL 10 MG TABLET: 10 | 30 days supply | Qty: 30 | Fill #3

## 2018-07-10 MED FILL — LISINOPRIL 10 MG TABLET: 10 | 30 days supply | Qty: 30 | Fill #4

## 2018-07-12 MED FILL — BIKTARVY 50-200-25 MG TABS: 50-200-25 | 30 days supply | Qty: 30 | Fill #2

## 2018-07-16 ENCOUNTER — Telehealth: Payer: Self-pay | Admitting: Behavioral Health

## 2018-07-16 NOTE — Telephone Encounter (Signed)
PA request for Biktarvy was faxed from RX benefits.  Called [employer] and was told Jeannette How handled it and no Prior Authorization needs to be initiated at this time Angeline Slim RN

## 2018-08-08 MED FILL — BIKTARVY 50-200-25 MG TABS: 50-200-25 | 30 days supply | Qty: 30 | Fill #3

## 2018-09-10 MED FILL — LISINOPRIL 10 MG TABLET: 10 | 30 days supply | Qty: 30 | Fill #5

## 2018-09-11 MED FILL — BIKTARVY 50-200-25 MG TABS: 50-200-25 | 30 days supply | Qty: 30 | Fill #4

## 2018-09-17 ENCOUNTER — Telehealth: Payer: Self-pay

## 2018-09-17 NOTE — Telephone Encounter (Signed)
Timothy Perry conducted his month 51 Reprieve visit over the phone. He is taking his study medication and Biktarvy daily. He reports no muscle aches, weakness, or medication changes. He reports that on 3/11 he experienced pain and trouble urinating. After visiting an Urgent Care, his provider suspected he passed a small kidney stone.He will return for his month 81 Reprieve visit on 7/15.

## 2018-10-09 ENCOUNTER — Other Ambulatory Visit: Payer: Self-pay | Admitting: Infectious Disease

## 2018-10-09 DIAGNOSIS — I10 Essential (primary) hypertension: Secondary | ICD-10-CM

## 2018-10-10 MED FILL — LISINOPRIL 10 MG TABLET: 10 | 30 days supply | Qty: 30 | Fill #0

## 2018-10-10 MED FILL — BIKTARVY 50-200-25 MG TABS: 50-200-25 | 30 days supply | Qty: 30 | Fill #5

## 2018-11-05 ENCOUNTER — Other Ambulatory Visit: Payer: Self-pay | Admitting: Infectious Disease

## 2018-11-05 DIAGNOSIS — B20 Human immunodeficiency virus [HIV] disease: Secondary | ICD-10-CM

## 2018-11-12 MED FILL — BIKTARVY 50-200-25 MG TABS: 50-200-25 | 30 days supply | Qty: 30 | Fill #0

## 2018-11-12 MED FILL — LISINOPRIL 10 MG TABLET: 10 | 30 days supply | Qty: 30 | Fill #1

## 2018-12-13 MED FILL — BIKTARVY 50-200-25 MG TABS: 50-200-25 | 30 days supply | Qty: 30 | Fill #1

## 2018-12-13 MED FILL — LISINOPRIL 10 MG TABLET: 10 | 30 days supply | Qty: 30 | Fill #2

## 2019-01-08 ENCOUNTER — Other Ambulatory Visit: Payer: Self-pay

## 2019-01-08 ENCOUNTER — Ambulatory Visit (INDEPENDENT_AMBULATORY_CARE_PROVIDER_SITE_OTHER): Payer: 59 | Admitting: Infectious Disease

## 2019-01-08 ENCOUNTER — Encounter (INDEPENDENT_AMBULATORY_CARE_PROVIDER_SITE_OTHER): Payer: Self-pay

## 2019-01-08 ENCOUNTER — Encounter: Payer: Self-pay | Admitting: Infectious Disease

## 2019-01-08 VITALS — BP 116/80 | HR 94 | Temp 98.3°F | Wt 286.0 lb

## 2019-01-08 DIAGNOSIS — B20 Human immunodeficiency virus [HIV] disease: Secondary | ICD-10-CM | POA: Diagnosis not present

## 2019-01-08 DIAGNOSIS — Z113 Encounter for screening for infections with a predominantly sexual mode of transmission: Secondary | ICD-10-CM

## 2019-01-08 DIAGNOSIS — R238 Other skin changes: Secondary | ICD-10-CM | POA: Diagnosis not present

## 2019-01-08 DIAGNOSIS — Z79899 Other long term (current) drug therapy: Secondary | ICD-10-CM

## 2019-01-08 DIAGNOSIS — Z006 Encounter for examination for normal comparison and control in clinical research program: Secondary | ICD-10-CM

## 2019-01-08 DIAGNOSIS — I10 Essential (primary) hypertension: Secondary | ICD-10-CM | POA: Diagnosis not present

## 2019-01-08 HISTORY — DX: Other skin changes: R23.8

## 2019-01-08 MED ORDER — VALACYCLOVIR HCL 1 G PO TABS: ORAL_TABLET | ORAL | 5 refills | Status: DC

## 2019-01-08 MED ORDER — BIKTARVY 50-200-25 MG PO TABS: 1.0000 | ORAL_TABLET | Freq: Every day | ORAL | 11 refills | Status: DC

## 2019-01-08 MED ORDER — LISINOPRIL 10 MG PO TABS: 10.0000 mg | ORAL_TABLET | Freq: Every day | ORAL | 11 refills | Status: DC

## 2019-01-08 NOTE — Progress Notes (Signed)
Subjective:   Chief complaint: He is concerned that he is having an outbreak of herpes on his scalp   Patient ID: Timothy Perry, male    DOB: [birth year], 43 y.o.   MRN: [id number]  HPI  43 year old man originally enrolled into the START trial and --> Atripla --> BIKTARVY.  Is been taking this for some time now and is remained virologically suppressed.  Sherren Mocha states today that he is having what he thinks is another outbreak of herpes on his scalp.  He tells me has had outbreaks on his arm and his back that have been labeled as either zoster or herpes simplex infection.  I told him is a very unusual areas for this to occur especially since her does not seem to be any dermatomal distribution to his lesions.  He does have some vesicles in the posterior of his scalp and one cluster but there is no dermatomal distribution to this.  I was able to unroofed vesicles and send material for PCR for varicella-zoster and herpes simplex infection.     Past Medical History:  Diagnosis Date  . HIV (human immunodeficiency virus infection) ([place]) 03/26/2009   [facility];  participant in START Study  . Priapism    occasional, mornings  . Substance abuse ([place])     No past surgical history on file.  Family History  Problem Relation Age of Onset  . Thyroid disease Mother   . Asthma Brother       Social History   Socioeconomic History  . Marital status: Single    Spouse name: [name]  . Number of children: 0  . Years of education: 67  . Highest education level: Not on file  Occupational History  . Occupation: Chief Executive Officer  Social Needs  . Financial resource strain: Not on file  . Food insecurity    Worry: Not on file    Inability: Not on file  . Transportation needs    Medical: Not on file    Non-medical: Not on file  Tobacco Use  . Smoking status: Current Every Day Smoker    Packs/day: 0.20    Years: 22.00    Pack years: 4.40    Types: Cigarettes, E-cigarettes  . Smokeless  tobacco: Never Used  . Tobacco comment: electronic cig  Substance and Sexual Activity  . Alcohol use: Yes    Alcohol/week: 0.0 - 2.0 standard drinks    Comment: social  . Drug use: No  . Sexual activity: Yes    Partners: Male    Comment: inconsistent condom use  Lifestyle  . Physical activity    Days per week: Not on file    Minutes per session: Not on file  . Stress: Not on file  Relationships  . Social Herbalist on phone: Not on file    Gets together: Not on file    Attends religious service: Not on file    Active member of club or organization: Not on file    Attends meetings of clubs or organizations: Not on file    Relationship status: Not on file  Other Topics Concern  . Not on file  Social History Narrative   Engaged.  Just bought a house together.    No Known Allergies   Current Outpatient Medications:  .  bictegravir-emtricitabine-tenofovir AF (BIKTARVY) 50-200-25 MG TABS tablet, Take 1 tablet by mouth daily., Disp: 30 tablet, Rfl: 11 .  cyclobenzaprine (FLEXERIL) 10 MG tablet, Take 1 tablet (10  mg total) by mouth 3 (three) times daily as needed for muscle spasms., Disp: 30 tablet, Rfl: 0 .  lisinopril (ZESTRIL) 10 MG tablet, Take 1 tablet (10 mg total) by mouth daily., Disp: 30 tablet, Rfl: 11 .  Naproxen Sodium (ALEVE) 220 MG CAPS, Take by mouth.  , Disp: , Rfl:  .  Study - REPRIEVE [phone] - pitavastatin 4 mg or placebo tablet, Take 1 tablet (4 mg total) by mouth daily., Disp: , Rfl:  .  valACYclovir (VALTREX) 1000 MG tablet, TAKE 1 TABLET (1,000 MG TOTAL) BY MOUTH 2 (TWO) TIMES DAILY., Disp: 20 tablet, Rfl: 5  Current Facility-Administered Medications:  .  methylPREDNISolone acetate (DEPO-MEDROL) injection 80 mg, 80 mg, Intramuscular, Once, Carmelina DaneAnderson, Jeffery S, MD    Review of Systems  Constitutional: Negative for chills and fever.  HENT: Negative for congestion and sore throat.   Eyes: Negative for photophobia.  Respiratory: Negative for cough,  shortness of breath and wheezing.   Cardiovascular: Negative for chest pain, palpitations and leg swelling.  Gastrointestinal: Negative for abdominal pain, blood in stool, constipation, diarrhea, nausea and vomiting.  Genitourinary: Negative for dysuria, flank pain and hematuria.  Musculoskeletal: Negative for back pain and myalgias.  Skin: Positive for rash.  Neurological: Negative for dizziness, weakness and headaches.  Hematological: Does not bruise/bleed easily.  Psychiatric/Behavioral: Negative for suicidal ideas.       Objective:   Physical Exam  Constitutional: He is oriented to person, place, and time. He appears well-developed and well-nourished.  HENT:  Head: Normocephalic and atraumatic.  Eyes: Conjunctivae and EOM are normal.  Neck: Normal range of motion. Neck supple.  Cardiovascular: Normal rate and regular rhythm.  Pulmonary/Chest: Effort normal. No respiratory distress. He has no wheezes.  Abdominal: Soft. He exhibits no distension.  Musculoskeletal: Normal range of motion.        General: No tenderness or edema.  Neurological: He is alert and oriented to person, place, and time.  Skin: Skin is warm and dry. Rash noted. No erythema. No pallor.  Psychiatric: He has a normal mood and affect. His speech is normal and behavior is normal. Thought content normal.    January 08, 2019: Scalp with some vesicular lesions see below:          Assessment & Plan:   HIV disease: Continue BIKTARVY. RTC in 6 months   HTN: continue ACEI  Vesicular lesions: I refilled his Valtrex but I am skeptical that these lesions are due to either herpes or varicella zoster.  I did send material for PCR testing for these viruses  STI screening we will screen for gonorrhea chlamydia through urine and also extra genital sites, along with syphilis test

## 2019-01-08 NOTE — Research (Signed)
Participant here for study [phone]. He is doing well with taking his ARV and study medication daily. He keeps a pill planner and rarely forgets except for days where he is very tired after work. He has no new complaints or concerns related to research. His next visit for study is scheduled 05/14/19.

## 2019-01-09 LAB — T-HELPER CELL (CD4) - (RCID CLINIC ONLY)
CD4 % Helper T Cell: 55 % (ref 33–65)
CD4 T Cell Abs: 935 /uL (ref 400–1790)

## 2019-01-09 LAB — CYTOLOGY, (ORAL, ANAL, URETHRAL) ANCILLARY ONLY
Chlamydia: NEGATIVE
Chlamydia: NEGATIVE
Neisseria Gonorrhea: NEGATIVE
Neisseria Gonorrhea: NEGATIVE

## 2019-01-11 LAB — CBC WITH DIFFERENTIAL/PLATELET
Absolute Monocytes: 249 cells/uL (ref 200–950)
Basophils Absolute: 48 cells/uL (ref 0–200)
Basophils Relative: 0.9 %
Eosinophils Absolute: 111 cells/uL (ref 15–500)
Eosinophils Relative: 2.1 %
HCT: 39.4 % (ref 38.5–50.0)
Hemoglobin: 13.2 g/dL (ref 13.2–17.1)
Lymphs Abs: 1765 cells/uL (ref 850–3900)
MCH: 33.8 pg — ABNORMAL HIGH (ref 27.0–33.0)
MCHC: 33.5 g/dL (ref 32.0–36.0)
MCV: 100.8 fL — ABNORMAL HIGH (ref 80.0–100.0)
MPV: 9.6 fL (ref 7.5–12.5)
Monocytes Relative: 4.7 %
Neutro Abs: 3127 cells/uL (ref 1500–7800)
Neutrophils Relative %: 59 %
Platelets: 201 10*3/uL (ref 140–400)
RBC: 3.91 10*6/uL — ABNORMAL LOW (ref 4.20–5.80)
RDW: 12.5 % (ref 11.0–15.0)
Total Lymphocyte: 33.3 %
WBC: 5.3 10*3/uL (ref 3.8–10.8)

## 2019-01-11 LAB — COMPLETE METABOLIC PANEL WITH GFR
AG Ratio: 1.4 (calc) (ref 1.0–2.5)
ALT: 159 U/L — ABNORMAL HIGH (ref 9–46)
AST: 109 U/L — ABNORMAL HIGH (ref 10–40)
Albumin: 4.2 g/dL (ref 3.6–5.1)
Alkaline phosphatase (APISO): 74 U/L (ref 36–130)
BUN: 8 mg/dL (ref 7–25)
CO2: 26 mmol/L (ref 20–32)
Calcium: 9.2 mg/dL (ref 8.6–10.3)
Chloride: 101 mmol/L (ref 98–110)
Creat: 1.04 mg/dL (ref 0.60–1.35)
GFR, Est African American: 100 mL/min/{1.73_m2} (ref >=60)
GFR, Est Non African American: 86 mL/min/{1.73_m2} (ref >=60)
Globulin: 3.1 g/dL (calc) (ref 1.9–3.7)
Glucose, Bld: 156 mg/dL — ABNORMAL HIGH (ref 65–99)
Potassium: 4.1 mmol/L (ref 3.5–5.3)
Sodium: 136 mmol/L (ref 135–146)
Total Bilirubin: 1.2 mg/dL (ref 0.2–1.2)
Total Protein: 7.3 g/dL (ref 6.1–8.1)

## 2019-01-11 LAB — LIPID PANEL
Cholesterol: 160 mg/dL (ref <200)
HDL: 27 mg/dL — ABNORMAL LOW (ref >=40)
LDL Cholesterol (Calc): 96 mg/dL (calc)
Non-HDL Cholesterol (Calc): 133 mg/dL (calc) — ABNORMAL HIGH (ref <130)
Total CHOL/HDL Ratio: 5.9 (calc) — ABNORMAL HIGH (ref <5.0)
Triglycerides: 246 mg/dL — ABNORMAL HIGH (ref <150)

## 2019-01-11 LAB — RPR: RPR Ser Ql: NONREACTIVE

## 2019-01-11 LAB — HIV-1 RNA QUANT-NO REFLEX-BLD
HIV 1 RNA Quant: <20 copies/mL
HIV-1 RNA Quant, Log: <1.3 Log copies/mL

## 2019-01-12 LAB — HSV DNA BY PCR (REFERENCE LAB)
HSV 1 DNA: NOT DETECTED
HSV 2 DNA: DETECTED — AB

## 2019-01-13 MED FILL — valACYclovir HCL 1 GM TABS: 1 | 10 days supply | Qty: 20 | Fill #0

## 2019-01-22 MED FILL — BIKTARVY 50-200-25 MG TABS: 50-200-25 | 30 days supply | Qty: 30 | Fill #0

## 2019-01-22 MED FILL — LISINOPRIL 10 MG TABLET: 10 | 30 days supply | Qty: 30 | Fill #0

## 2019-01-28 MED ORDER — VALACYCLOVIR HCL 500 MG PO TABS: 500.0000 mg | ORAL_TABLET | Freq: Every day | ORAL | 11 refills | Status: DC

## 2019-01-28 MED FILL — VALACYCLOVIR HCL 500 MG TAB: 500 | 30 days supply | Qty: 30 | Fill #0

## 2019-01-28 MED FILL — valACYclovir HCL 1 GM TABS: 1 | 10 days supply | Qty: 20 | Fill #1

## 2019-01-28 NOTE — Telephone Encounter (Signed)
Spoke with patient over the phone. Patient confirmed that he just refilled his prescription. Patient informed Dr. Tommy Medal agrees on prophylactic Valtrex at 500 mg daily. Patient advised to contact pharmacy for advise on breaking current 1000mg  dose in half. Pharmacist from [facility] is currently out of the office.   Patient agrees and was appreciative of phone call.  Eugenia Mcalpine, LPN

## 2019-01-28 NOTE — Addendum Note (Signed)
Addended by: Eugenia Mcalpine on: 01/28/2019 03:51 PM   Modules accepted: Orders

## 2019-02-24 MED FILL — BIKTARVY 50-200-25 MG TABS: 50-200-25 | 30 days supply | Qty: 30 | Fill #1

## 2019-02-24 MED FILL — LISINOPRIL 10 MG TABS: 10 | 30 days supply | Qty: 30 | Fill #1

## 2019-03-25 MED FILL — VALACYCLOVIR HCL 500 MG TAB: 500 | 30 days supply | Qty: 30 | Fill #1

## 2019-03-25 MED FILL — LISINOPRIL 10 MG TABS: 10 | 30 days supply | Qty: 30 | Fill #2

## 2019-03-25 MED FILL — BIKTARVY 50-200-25 MG TABS: 50-200-25 | 30 days supply | Qty: 30 | Fill #2

## 2019-04-25 MED FILL — LISINOPRIL 10 MG TABS: 10 | 30 days supply | Qty: 30 | Fill #3

## 2019-04-25 MED FILL — VALACYCLOVIR HCL 500 MG TAB: 500 | 30 days supply | Qty: 30 | Fill #2

## 2019-04-25 MED FILL — BIKTARVY 50-200-25 MG TABS: 50-200-25 | 30 days supply | Qty: 30 | Fill #3

## 2019-05-14 ENCOUNTER — Encounter (INDEPENDENT_AMBULATORY_CARE_PROVIDER_SITE_OTHER): Payer: 59

## 2019-05-14 ENCOUNTER — Other Ambulatory Visit: Payer: Self-pay

## 2019-05-14 VITALS — BP 115/78 | HR 83 | Temp 98.2°F | Wt 277.6 lb

## 2019-05-14 DIAGNOSIS — Z006 Encounter for examination for normal comparison and control in clinical research program: Secondary | ICD-10-CM

## 2019-05-16 NOTE — Research (Signed)
Participant here for [phone] study. He is doing excellent with adherence to medication. He has no new issues or complaints. He received a 90 day supply of study medication. He is scheduled for his next study visit in March, 2021.

## 2019-05-27 MED FILL — LISINOPRIL 10 MG TABS: 10 | 30 days supply | Qty: 30 | Fill #4

## 2019-05-27 MED FILL — VALACYCLOVIR HCL 500 MG TAB: 500 | 30 days supply | Qty: 30 | Fill #3

## 2019-05-27 MED FILL — BIKTARVY 50-200-25 MG TABS: 50-200-25 | 30 days supply | Qty: 30 | Fill #4

## 2019-06-05 ENCOUNTER — Other Ambulatory Visit: Payer: Self-pay

## 2019-06-05 DIAGNOSIS — Z20822 Contact with and (suspected) exposure to covid-19: Secondary | ICD-10-CM

## 2019-06-07 LAB — NOVEL CORONAVIRUS, NAA: SARS-CoV-2, NAA: NOT DETECTED

## 2019-07-08 MED FILL — LISINOPRIL 10 MG TABS: 10 | 30 days supply | Qty: 30 | Fill #5

## 2019-07-08 MED FILL — VALACYCLOVIR HCL 500 MG TAB: 500 | 30 days supply | Qty: 30 | Fill #4

## 2019-07-08 MED FILL — BIKTARVY 50-200-25 MG TABS: 50-200-25 | 30 days supply | Qty: 30 | Fill #5

## 2019-08-11 MED FILL — VALACYCLOVIR HCL 500 MG TAB: 500 | 30 days supply | Qty: 30 | Fill #5

## 2019-08-11 MED FILL — BIKTARVY 50-200-25 MG TABS: 50-200-25 | 30 days supply | Qty: 30 | Fill #6

## 2019-08-11 MED FILL — LISINOPRIL 10 MG TABS: 10 | 30 days supply | Qty: 30 | Fill #6

## 2019-09-11 ENCOUNTER — Encounter (INDEPENDENT_AMBULATORY_CARE_PROVIDER_SITE_OTHER): Payer: Self-pay | Admitting: *Deleted

## 2019-09-11 ENCOUNTER — Other Ambulatory Visit: Payer: Self-pay

## 2019-09-11 VITALS — BP 117/81 | HR 101 | Temp 98.2°F | Wt 274.5 lb

## 2019-09-11 DIAGNOSIS — Z006 Encounter for examination for normal comparison and control in clinical research program: Secondary | ICD-10-CM

## 2019-09-11 MED FILL — LISINOPRIL 10 MG TABS: 10 | 30 days supply | Qty: 30 | Fill #7

## 2019-09-11 MED FILL — VALACYCLOVIR HCL 500 MG TAB: 500 | 30 days supply | Qty: 30 | Fill #6

## 2019-09-11 MED FILL — BIKTARVY 50-200-25 MG TABS: 50-200-25 | 30 days supply | Qty: 30 | Fill #7

## 2019-09-11 NOTE — Research (Signed)
Timothy Perry was here for his month 64 visit for Reprieve, A Randomized Trial to Prevent Vascular Events in HIV (study drug is Pitavastatin 4mg  or placebo)   He denies any new problems except for anxiety from trying to work full-time and go to school. He has subsequently quit school for now and doing somewhat better. He has been diagnosed with sleep apnea in December and is trying to get on a better pattern for sleep with his CPAP. He is interested in the Hep B vaccine study and took a consent home to review it. He has not been tested for Hep B surface antibody since he was vaccinated in 2011 when he was first diagnosed, so there is a good chance he may be eligible for the study. He will be returning for Reprieve in July.

## 2019-09-12 LAB — HEPATITIS B SURFACE ANTIBODY,QUALITATIVE: Hep B S Ab: NONREACTIVE

## 2019-09-17 ENCOUNTER — Other Ambulatory Visit: Payer: Self-pay

## 2019-09-17 ENCOUNTER — Encounter: Payer: Self-pay | Admitting: Family

## 2019-09-17 ENCOUNTER — Encounter (INDEPENDENT_AMBULATORY_CARE_PROVIDER_SITE_OTHER): Payer: Self-pay | Admitting: Family

## 2019-09-17 VITALS — BP 121/82 | HR 90 | Temp 98.0°F | Wt 278.0 lb

## 2019-09-17 DIAGNOSIS — Z006 Encounter for examination for normal comparison and control in clinical research program: Secondary | ICD-10-CM

## 2019-09-17 NOTE — Research (Signed)
Timothy Perry was here for screening for the [facility] study. He had taken the consent home to review a few days. We reviewed it here together and informed consent was obtained. He is c/o left shoulder pain, he said started Sunday, and denies any other new problems. He was originally vaccinated for Hep B in 2011 and tested negative for Hep B surface antibody last week and has never had a positive antibody test. He saw Marcos Eke for his PE today. We have planned tentatively entry for 4/5 .

## 2019-09-17 NOTE — Progress Notes (Addendum)
Subjective:    Patient ID: Timothy Perry, male    DOB: 09-Aug-1975, 44 y.o.   MRN: [id number]  No chief complaint on file.   HPI:  Timothy Perry is a 44 y.o. male with previous medical history of well controlled HIV disease presenting for evaluation for entry into research study.  Timothy Perry is feeling well today with no concerns/complaints.   No Known Allergies    Outpatient Medications Prior to Visit  Medication Sig Dispense Refill  . bictegravir-emtricitabine-tenofovir AF (BIKTARVY) 50-200-25 MG TABS tablet Take 1 tablet by mouth daily. 30 tablet 11  . cyclobenzaprine (FLEXERIL) 10 MG tablet Take 1 tablet (10 mg total) by mouth 3 (three) times daily as needed for muscle spasms. 30 tablet 0  . lisinopril (ZESTRIL) 10 MG tablet Take 1 tablet (10 mg total) by mouth daily. 30 tablet 11  . Naproxen Sodium (ALEVE) 220 MG CAPS Take by mouth.      Crist Fat - REPRIEVE [id number] - pitavastatin 4 mg or placebo tablet Take 1 tablet (4 mg total) by mouth daily.    . valACYclovir (VALTREX) 500 MG tablet Take 1 tablet (500 mg total) by mouth daily. 30 tablet 11   Facility-Administered Medications Prior to Visit  Medication Dose Route Frequency Provider Last Rate Last Admin  . methylPREDNISolone acetate (DEPO-MEDROL) injection 80 mg  80 mg Intramuscular Once Roselee Culver, MD         Past Medical History:  Diagnosis Date  . HIV (human immunodeficiency virus infection) ([place]) 03/26/2009   [facility];  participant in START Study  . Priapism    occasional, mornings  . Substance abuse ([place])   . Vesicular rash 01/08/2019     No past surgical history on file.    Family History  Problem Relation Age of Onset  . Thyroid disease Mother   . Asthma Brother       Social History   Socioeconomic History  . Marital status: Single    Spouse name: [name]  . Number of children: 0  . Years of education: 46  . Highest education level: Not on file  Occupational History  . Occupation:  Substance Abuse Counselor   Tobacco Use  . Smoking status: Current Every Day Smoker    Packs/day: 0.20    Years: 22.00    Pack years: 4.40    Types: E-cigarettes  . Smokeless tobacco: Never Used  . Tobacco comment: electronic cig  Substance and Sexual Activity  . Alcohol use: Yes    Alcohol/week: 0.0 - 2.0 standard drinks    Comment: social  . Drug use: Yes    Types: Marijuana    Comment: Rarely   . Sexual activity: Yes    Partners: Male    Comment: inconsistent condom use  Other Topics Concern  . Not on file  Social History Narrative   Engaged.  Just bought a house together.   Social Determinants of Health   Financial Resource Strain:   . Difficulty of Paying Living Expenses:   Food Insecurity:   . Worried About Charity fundraiser in the Last Year:   . Arboriculturist in the Last Year:   Transportation Needs:   . Film/video editor (Medical):   Marland Kitchen Lack of Transportation (Non-Medical):   Physical Activity:   . Days of Exercise per Week:   . Minutes of Exercise per Session:   Stress:   . Feeling of Stress :   Social Connections:   .  Frequency of Communication with Friends and Family:   . Frequency of Social Gatherings with Friends and Family:   . Attends Religious Services:   . Active Member of Clubs or Organizations:   . Attends Archivist Meetings:   Marland Kitchen Marital Status:   Intimate Partner Violence:   . Fear of Current or Ex-Partner:   . Emotionally Abused:   Marland Kitchen Physically Abused:   . Sexually Abused:       Review of Systems  Constitutional: Negative for chills, diaphoresis, fatigue and fever.  HENT: Negative.   Eyes: Negative.   Respiratory: Negative for cough, chest tightness, shortness of breath and wheezing.   Cardiovascular: Negative for chest pain.  Gastrointestinal: Negative for abdominal pain, diarrhea, nausea and vomiting.  Endocrine: Negative.   Genitourinary: Negative.   Musculoskeletal: Negative.   Skin: Negative.     Allergic/Immunologic: Negative.   Neurological: Negative.   Psychiatric/Behavioral: Negative.        Objective:    BP 121/82   Pulse 90   Temp 98 F (36.7 C) (Oral)   Wt 278 lb (126.1 kg)   BMI 38.77 kg/m  Nursing note and vital signs reviewed.  Physical Exam Constitutional:      General: He is not in acute distress.    Appearance: He is well-developed. He is not ill-appearing.  HENT:     Right Ear: Tympanic membrane and ear canal normal.     Left Ear: Tympanic membrane and ear canal normal.     Mouth/Throat:     Mouth: Mucous membranes are dry.     Pharynx: Oropharynx is clear. No oropharyngeal exudate or posterior oropharyngeal erythema.  Eyes:     Extraocular Movements: Extraocular movements intact.     Pupils: Pupils are equal, round, and reactive to light.  Cardiovascular:     Rate and Rhythm: Normal rate and regular rhythm.     Heart sounds: Normal heart sounds. No murmur.  Pulmonary:     Effort: Pulmonary effort is normal. No respiratory distress.     Breath sounds: Normal breath sounds. No stridor. No wheezing, rhonchi or rales.  Chest:     Chest wall: No tenderness.  Abdominal:     General: There is no distension.     Palpations: There is no mass.     Tenderness: There is no abdominal tenderness. There is no right CVA tenderness, left CVA tenderness, guarding or rebound.     Hernia: No hernia is present.  Musculoskeletal:        General: No swelling, tenderness, deformity or signs of injury.     Cervical back: Normal range of motion and neck supple. No rigidity or tenderness.  Skin:    General: Skin is warm and dry.  Neurological:     Mental Status: He is alert and oriented to person, place, and time.  Psychiatric:        Behavior: Behavior normal.        Thought Content: Thought content normal.        Judgment: Judgment normal.  Assessment & Plan:   Patient Active Problem List   Diagnosis Date Noted  . Research exam 09/17/2019  . Vesicular rash 01/08/2019  . Anxiety and depression 11/22/2016  . Syphilis contact, untreated 07/30/2013  . Transaminitis 07/30/2013  . Other and unspecified hyperlipidemia 07/30/2013  . Herpes zoster 05/07/2012  . Low back pain 05/09/2011  . Nonorganic enuresis 05/09/2011  . dermatitis 02/16/2010  . HEADACHE 02/16/2010  . HIV disease ([place]) 10/19/2009     Problem List Items Addressed This Visit      Other   Research exam    Mr. Carmen is a 44 y/o male presenting for physical exam for entry into research study. A full assessment of health, surgical, social and family histories reviewed. Physical exam without significant findings. Based on this assessment Mr. Aderhold is medically cleared to participate with no restrictions.        Other Visit Diagnoses    Examination of participant in clinical trial    -  Primary   Relevant Orders   Bilirubin, Direct   Comp Met (CMET)   Protime-INR ( [facility] ONLY)   HgB A1c   CK (Creatine Kinase)   Phosphorus   Hepatitis B surface antigen   Hepatitis B core antibody, total   HIV 1 RNA quant-no reflex-bld   Hepatitis B surface antibody,quantitative       I am having Derrick Ravel "Tod" maintain his Naproxen Sodium, cyclobenzaprine, REPRIEVE A5332 pitavastatin or placebo, Biktarvy, lisinopril, and valACYclovir. We will continue to administer methylPREDNISolone acetate.   Follow-up: Per study protocol.    Terri Piedra, MSN, FNP-C Nurse Practitioner [facility] [place] number: [phone]

## 2019-09-17 NOTE — Assessment & Plan Note (Signed)
[name] is a 44 y/o male presenting for physical exam for entry into research study. A full assessment of health, surgical, social and family histories reviewed. Physical exam without significant findings. Based on this assessment [name] is medically cleared to participate with no restrictions.

## 2019-09-17 NOTE — Addendum Note (Signed)
Addended by: Jeanine Luz D on: 09/17/2019 05:09 PM   Modules accepted: Level of Service

## 2019-09-19 LAB — COMPREHENSIVE METABOLIC PANEL
AG Ratio: 1.6 (calc) (ref 1.0–2.5)
ALT: 123 U/L — ABNORMAL HIGH (ref 9–46)
AST: 59 U/L — ABNORMAL HIGH (ref 10–40)
Albumin: 4.4 g/dL (ref 3.6–5.1)
Alkaline phosphatase (APISO): 78 U/L (ref 36–130)
BUN/Creatinine Ratio: 6 (calc) (ref 6–22)
BUN: 6 mg/dL — ABNORMAL LOW (ref 7–25)
CO2: 24 mmol/L (ref 20–32)
Calcium: 8.8 mg/dL (ref 8.6–10.3)
Chloride: 103 mmol/L (ref 98–110)
Creat: 1.01 mg/dL (ref 0.60–1.35)
Globulin: 2.7 g/dL (calc) (ref 1.9–3.7)
Glucose, Bld: 277 mg/dL — ABNORMAL HIGH (ref 65–99)
Potassium: 3.9 mmol/L (ref 3.5–5.3)
Sodium: 136 mmol/L (ref 135–146)
Total Bilirubin: 1.4 mg/dL — ABNORMAL HIGH (ref 0.2–1.2)
Total Protein: 7.1 g/dL (ref 6.1–8.1)

## 2019-09-19 LAB — PHOSPHORUS: Phosphorus: 2.3 mg/dL — ABNORMAL LOW (ref 2.5–4.5)

## 2019-09-19 LAB — HEMOGLOBIN A1C
Hgb A1c MFr Bld: 5.8 % of total Hgb — ABNORMAL HIGH (ref <5.7)
Mean Plasma Glucose: 120 (calc)
eAG (mmol/L): 6.6 (calc)

## 2019-09-19 LAB — HEPATITIS B SURFACE ANTIBODY, QUANTITATIVE: Hep B S AB Quant (Post): <5 m[IU]/mL — ABNORMAL LOW (ref >=10)

## 2019-09-19 LAB — HEPATITIS B CORE ANTIBODY, TOTAL: Hep B Core Total Ab: NONREACTIVE

## 2019-09-19 LAB — HIV-1 RNA QUANT-NO REFLEX-BLD
HIV 1 RNA Quant: <20 copies/mL
HIV-1 RNA Quant, Log: <1.3 Log copies/mL

## 2019-09-19 LAB — CK: Total CK: 257 U/L — ABNORMAL HIGH (ref 44–196)

## 2019-09-19 LAB — PROTIME-INR
INR: 1.1
Prothrombin Time: 11.9 s — ABNORMAL HIGH (ref 9.0–11.5)

## 2019-09-19 LAB — BILIRUBIN, DIRECT: Bilirubin, Direct: 0.3 mg/dL — ABNORMAL HIGH (ref 0.0–0.2)

## 2019-09-19 LAB — HEPATITIS B SURFACE ANTIGEN: Hepatitis B Surface Ag: NONREACTIVE

## 2019-09-22 MED FILL — predniSONE 20 MG TABS: 20 | 6 days supply | Qty: 12 | Fill #0

## 2019-09-22 MED FILL — CYCLOBENZAPRINE HCL 10 MG T: 10 | 10 days supply | Qty: 30 | Fill #0

## 2019-09-23 ENCOUNTER — Encounter: Payer: Self-pay | Admitting: Infectious Disease

## 2019-09-23 LAB — CD4/CD8 (T-HELPER/T-SUPPRESSOR CELL)
CD4 % Helper T Cell: 54.1
CD4 Count: 1082
CD8 % Suppressor T Cell: 29.3
CD8 T Cell Abs: 586

## 2019-10-17 MED FILL — BIKTARVY 50-200-25 MG TABS: 50-200-25 | 30 days supply | Qty: 30 | Fill #8

## 2019-10-17 MED FILL — LISINOPRIL 10 MG TABS: 10 | 30 days supply | Qty: 30 | Fill #8

## 2019-10-17 MED FILL — VALACYCLOVIR HCL 500 MG TAB: 500 | 30 days supply | Qty: 30 | Fill #7

## 2019-11-17 MED FILL — BIKTARVY 50-200-25 MG TABS: 50-200-25 | 30 days supply | Qty: 30 | Fill #9

## 2019-11-17 MED FILL — VALACYCLOVIR HCL 500 MG TAB: 500 | 30 days supply | Qty: 30 | Fill #8

## 2019-11-17 MED FILL — LISINOPRIL 10 MG TABS: 10 | 30 days supply | Qty: 30 | Fill #9

## 2019-12-26 MED FILL — VALACYCLOVIR HCL 500 MG TAB: 500 | 30 days supply | Qty: 30 | Fill #9

## 2019-12-26 MED FILL — BIKTARVY 50-200-25 MG TABS: 50-200-25 | 30 days supply | Qty: 30 | Fill #10

## 2019-12-26 MED FILL — LISINOPRIL 10 MG TABS: 10 | 30 days supply | Qty: 30 | Fill #10

## 2020-01-13 ENCOUNTER — Other Ambulatory Visit: Payer: Self-pay | Admitting: Infectious Disease

## 2020-01-13 DIAGNOSIS — I10 Essential (primary) hypertension: Secondary | ICD-10-CM

## 2020-01-13 DIAGNOSIS — B20 Human immunodeficiency virus [HIV] disease: Secondary | ICD-10-CM

## 2020-01-13 MED FILL — LISINOPRIL 10 MG TABS: 10 | 30 days supply | Qty: 30 | Fill #0

## 2020-01-15 ENCOUNTER — Other Ambulatory Visit: Payer: Self-pay

## 2020-01-15 ENCOUNTER — Encounter (INDEPENDENT_AMBULATORY_CARE_PROVIDER_SITE_OTHER): Payer: Self-pay | Admitting: *Deleted

## 2020-01-15 VITALS — BP 116/82 | HR 91 | Temp 98.3°F | Wt 264.0 lb

## 2020-01-15 DIAGNOSIS — Z006 Encounter for examination for normal comparison and control in clinical research program: Secondary | ICD-10-CM

## 2020-01-16 NOTE — Research (Signed)
Timothy Perry was here for his month 68 visit for Reprieve. He has lost a few pounds since March. His liver enzymes were elevated then, as well as the glucose so hopefully the weight loss has improved it some. He denies any new problems or medications and will be returning for study in November.

## 2020-01-27 MED FILL — VALACYCLOVIR HCL 500 MG TAB: 500 | 30 days supply | Qty: 30 | Fill #10

## 2020-01-30 MED FILL — BIKTARVY 50-200-25 MG TABS: 50-200-25 | 30 days supply | Qty: 30 | Fill #0

## 2020-02-11 ENCOUNTER — Other Ambulatory Visit: Payer: Self-pay

## 2020-02-11 ENCOUNTER — Ambulatory Visit (INDEPENDENT_AMBULATORY_CARE_PROVIDER_SITE_OTHER): Payer: Managed Care, Other (non HMO) | Admitting: Infectious Disease

## 2020-02-11 ENCOUNTER — Encounter: Payer: Self-pay | Admitting: Infectious Disease

## 2020-02-11 VITALS — HR 105 | Temp 98.1°F | Wt 261.0 lb

## 2020-02-11 DIAGNOSIS — Z23 Encounter for immunization: Secondary | ICD-10-CM | POA: Diagnosis not present

## 2020-02-11 DIAGNOSIS — B009 Herpesviral infection, unspecified: Secondary | ICD-10-CM | POA: Diagnosis not present

## 2020-02-11 DIAGNOSIS — B20 Human immunodeficiency virus [HIV] disease: Secondary | ICD-10-CM | POA: Diagnosis not present

## 2020-02-11 DIAGNOSIS — K760 Fatty (change of) liver, not elsewhere classified: Secondary | ICD-10-CM

## 2020-02-11 DIAGNOSIS — R7401 Elevation of levels of liver transaminase levels: Secondary | ICD-10-CM | POA: Diagnosis not present

## 2020-02-11 HISTORY — DX: Fatty (change of) liver, not elsewhere classified: K76.0

## 2020-02-11 HISTORY — DX: Herpesviral infection, unspecified: B00.9

## 2020-02-11 NOTE — Progress Notes (Signed)
Subjective:   Chief complaint: For HIV disease   Patient ID: Timothy Perry, male    DOB: [birth year], 44 y.o.   MRN: [id number]  HPI  44 year old man originally enrolled into the START trial and --> Atripla --> BIKTARVY.  Is been taking this for some time now and is remained virologically suppressed.  I saw Tawanna Cooler roughly a year ago he was correct in his diagnosis of having a herpes outbreak on his scalp cannot be HSV-2 I prescribed him Valtrex for this.  He is done well recently he does admit to missing Biktarvy doses about once a month due to work schedule.  He gets home from an early morning shift 5 in the morning later in the afternoon sometimes he says the transition between recovering from being at work and going to sleep at the time where he might miss his Biktarvy dose.  Suggest maybe take it in the morning.  He was I am to enroll in our [facility] HIVE study transaminases were too high to enroll.  He does have fatty liver disease that was diagnosed on liver biopsy at [facility].     Past Medical History:  Diagnosis Date  . Herpes infection 02/11/2020  . HIV (human immunodeficiency virus infection) ([facility]) 03/26/2009   [facility];  participant in START Study  . Priapism    occasional, mornings  . Substance abuse ([facility])   . Vesicular rash 01/08/2019    No past surgical history on file.  Family History  Problem Relation Age of Onset  . Thyroid disease Mother   . Asthma Brother       Social History   Socioeconomic History  . Marital status: Single    Spouse name: [name]  . Number of children: 0  . Years of education: 63  . Highest education level: Not on file  Occupational History  . Occupation: Substance Abuse Counselor   Tobacco Use  . Smoking status: Current Every Day Smoker    Packs/day: 0.20    Years: 22.00    Pack years: 4.40    Types: E-cigarettes  . Smokeless tobacco: Never Used  . Tobacco comment: electronic cig  Substance and Sexual Activity  .  Alcohol use: Yes    Alcohol/week: 0.0 - 2.0 standard drinks    Comment: social  . Drug use: Not Currently    Types: Marijuana    Comment: Rarely   . Sexual activity: Yes    Partners: Male    Comment: inconsistent condom use  Other Topics Concern  . Not on file  Social History Narrative   Engaged.  Just bought a house together.   Social Determinants of Health   Financial Resource Strain:   . Difficulty of Paying Living Expenses:   Food Insecurity:   . Worried About Programme researcher, broadcasting/film/video in the Last Year:   . Barista in the Last Year:   Transportation Needs:   . Freight forwarder (Medical):   Marland Kitchen Lack of Transportation (Non-Medical):   Physical Activity:   . Days of Exercise per Week:   . Minutes of Exercise per Session:   Stress:   . Feeling of Stress :   Social Connections:   . Frequency of Communication with Friends and Family:   . Frequency of Social Gatherings with Friends and Family:   . Attends Religious Services:   . Active Member of Clubs or Organizations:   . Attends Banker Meetings:   .  Marital Status:     No Known Allergies   Current Outpatient Medications:  .  BIKTARVY 50-200-25 MG TABS tablet, TAKE 1 TABLET BY MOUTH DAILY., Disp: 30 tablet, Rfl: 0 .  cyclobenzaprine (FLEXERIL) 10 MG tablet, Take 1 tablet (10 mg total) by mouth 3 (three) times daily as needed for muscle spasms., Disp: 30 tablet, Rfl: 0 .  lisinopril (ZESTRIL) 10 MG tablet, TAKE 1 TABLET (10 MG TOTAL) BY MOUTH DAILY., Disp: 30 tablet, Rfl: 0 .  Naproxen Sodium (ALEVE) 220 MG CAPS, Take by mouth.  , Disp: , Rfl:  .  Study - REPRIEVE [phone] - pitavastatin 4 mg or placebo tablet, Take 1 tablet (4 mg total) by mouth daily., Disp: , Rfl:  .  valACYclovir (VALTREX) 500 MG tablet, Take 1 tablet (500 mg total) by mouth daily., Disp: 30 tablet, Rfl: 11  Current Facility-Administered Medications:  .  methylPREDNISolone acetate (DEPO-MEDROL) injection 80 mg, 80 mg, Intramuscular,  Once, Carmelina Dane, MD    Review of Systems  Constitutional: Negative for chills and fever.  HENT: Negative for congestion and sore throat.   Eyes: Negative for photophobia.  Respiratory: Negative for cough, shortness of breath and wheezing.   Cardiovascular: Negative for chest pain, palpitations and leg swelling.  Gastrointestinal: Negative for abdominal pain, blood in stool, constipation, diarrhea, nausea and vomiting.  Genitourinary: Negative for dysuria, flank pain and hematuria.  Musculoskeletal: Negative for back pain and myalgias.  Skin: Negative for rash.  Neurological: Negative for dizziness, weakness and headaches.  Hematological: Does not bruise/bleed easily.  Psychiatric/Behavioral: Negative for suicidal ideas.       Objective:   Physical Exam Constitutional:      Appearance: He is well-developed.  HENT:     Head: Normocephalic and atraumatic.  Eyes:     Conjunctiva/sclera: Conjunctivae normal.  Cardiovascular:     Rate and Rhythm: Normal rate and regular rhythm.  Pulmonary:     Effort: Pulmonary effort is normal. No respiratory distress.     Breath sounds: No wheezing.  Abdominal:     General: There is no distension.     Palpations: Abdomen is soft.  Musculoskeletal:        General: No tenderness. Normal range of motion.     Cervical back: Normal range of motion and neck supple.  Skin:    General: Skin is warm and dry.     Coloration: Skin is not pale.     Findings: No erythema.  Neurological:     Mental Status: He is alert and oriented to person, place, and time.  Psychiatric:        Mood and Affect: Mood normal.        Speech: Speech normal.        Behavior: Behavior normal.        Thought Content: Thought content normal.        Judgment: Judgment normal.        Assessment & Plan:   HIV disease: Continue BIKTARVY. 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 I offered him Heplisav-B now and in one month   Fatty liver: he has lost weight through diet and does not drink Etoh.

## 2020-02-16 NOTE — Addendum Note (Signed)
Addended by: Crissie Figures on: 02/16/2020 08:44 AM   Modules accepted: Orders

## 2020-03-03 ENCOUNTER — Other Ambulatory Visit: Payer: Self-pay | Admitting: Infectious Disease

## 2020-03-03 DIAGNOSIS — B20 Human immunodeficiency virus [HIV] disease: Secondary | ICD-10-CM

## 2020-03-03 MED FILL — BIKTARVY 50-200-25 MG TABS: 50-200-25 | 30 days supply | Qty: 30 | Fill #0

## 2020-03-03 MED FILL — BIKTARVY 50-200-25 MG TABS: 50-200-25 | 30 days supply | Qty: 30 | Fill #0 | Status: TO

## 2020-03-03 MED FILL — VALACYCLOVIR HCL 500 MG TAB: 500 | 30 days supply | Qty: 30 | Fill #0 | Status: TO

## 2020-03-03 MED FILL — VALACYCLOVIR HCL 500 MG TAB: 500 | 30 days supply | Qty: 30 | Fill #0

## 2020-03-15 ENCOUNTER — Other Ambulatory Visit: Payer: Self-pay

## 2020-03-15 ENCOUNTER — Ambulatory Visit (INDEPENDENT_AMBULATORY_CARE_PROVIDER_SITE_OTHER): Payer: Managed Care, Other (non HMO) | Admitting: *Deleted

## 2020-03-15 DIAGNOSIS — Z23 Encounter for immunization: Secondary | ICD-10-CM

## 2020-04-08 MED FILL — BIKTARVY 50-200-25 MG TABS: 50-200-25 | 30 days supply | Qty: 30 | Fill #0

## 2020-04-08 MED FILL — VALACYCLOVIR HCL 500 MG TAB: 500 | 30 days supply | Qty: 30 | Fill #0

## 2020-05-06 MED FILL — BIKTARVY 50-200-25 MG TABS: 50-200-25 | 30 days supply | Qty: 30 | Fill #1

## 2020-05-06 MED FILL — VALACYCLOVIR HCL 500 MG TAB: 500 | 30 days supply | Qty: 30 | Fill #1

## 2020-05-12 ENCOUNTER — Other Ambulatory Visit: Payer: Self-pay

## 2020-05-12 ENCOUNTER — Encounter (INDEPENDENT_AMBULATORY_CARE_PROVIDER_SITE_OTHER): Payer: Managed Care, Other (non HMO) | Admitting: *Deleted

## 2020-05-12 VITALS — BP 121/81 | HR 87 | Temp 98.3°F | Wt 265.2 lb

## 2020-05-12 DIAGNOSIS — Z006 Encounter for examination for normal comparison and control in clinical research program: Secondary | ICD-10-CM

## 2020-05-12 NOTE — Research (Signed)
Timothy Perry was here for his month 72 visit for Reprieve, A Randomized Trial to Prevent Vascular Events in HIV (study drug is Pitavastatin 4mg  or placebo). He says his adherence is good with the study meds. He denies any new problems and has been seeing his PCP regularly. He did get his covid booster the first of November. He will be returning inFebruary for the next study visit.

## 2020-06-11 MED FILL — BIKTARVY 50-200-25 MG TABS: 50-200-25 | 30 days supply | Qty: 30 | Fill #2

## 2020-06-11 MED FILL — VALACYCLOVIR HCL 500 MG TAB: 500 | 30 days supply | Qty: 30 | Fill #2

## 2020-07-13 MED FILL — BIKTARVY 50-200-25 MG TABS: 50-200-25 | 30 days supply | Qty: 30 | Fill #3

## 2020-07-13 MED FILL — VALACYCLOVIR HCL 500 MG TAB: 500 | 30 days supply | Qty: 30 | Fill #3

## 2020-08-03 ENCOUNTER — Other Ambulatory Visit: Payer: Self-pay

## 2020-08-03 ENCOUNTER — Other Ambulatory Visit: Payer: Managed Care, Other (non HMO)

## 2020-08-03 DIAGNOSIS — B20 Human immunodeficiency virus [HIV] disease: Secondary | ICD-10-CM

## 2020-08-04 LAB — T-HELPER CELL (CD4) - (RCID CLINIC ONLY)
CD4 % Helper T Cell: 54 % (ref 33–65)
CD4 T Cell Abs: 1185 /uL (ref 400–1790)

## 2020-08-06 LAB — CBC WITH DIFFERENTIAL/PLATELET
Absolute Monocytes: 340 cells/uL (ref 200–950)
Basophils Absolute: 71 cells/uL (ref 0–200)
Basophils Relative: 0.9 %
Eosinophils Absolute: 103 cells/uL (ref 15–500)
Eosinophils Relative: 1.3 %
HCT: 43.9 % (ref 38.5–50.0)
Hemoglobin: 15.3 g/dL (ref 13.2–17.1)
Lymphs Abs: 2457 cells/uL (ref 850–3900)
MCH: 32.5 pg (ref 27.0–33.0)
MCHC: 34.9 g/dL (ref 32.0–36.0)
MCV: 93.2 fL (ref 80.0–100.0)
MPV: 10.9 fL (ref 7.5–12.5)
Monocytes Relative: 4.3 %
Neutro Abs: 4930 cells/uL (ref 1500–7800)
Neutrophils Relative %: 62.4 %
Platelets: 197 10*3/uL (ref 140–400)
RBC: 4.71 10*6/uL (ref 4.20–5.80)
RDW: 12.6 % (ref 11.0–15.0)
Total Lymphocyte: 31.1 %
WBC: 7.9 10*3/uL (ref 3.8–10.8)

## 2020-08-06 LAB — COMPLETE METABOLIC PANEL WITH GFR
AG Ratio: 1.4 (calc) (ref 1.0–2.5)
ALT: 154 U/L — ABNORMAL HIGH (ref 9–46)
AST: 105 U/L — ABNORMAL HIGH (ref 10–40)
Albumin: 4.3 g/dL (ref 3.6–5.1)
Alkaline phosphatase (APISO): 84 U/L (ref 36–130)
BUN: 9 mg/dL (ref 7–25)
CO2: 27 mmol/L (ref 20–32)
Calcium: 9.4 mg/dL (ref 8.6–10.3)
Chloride: 101 mmol/L (ref 98–110)
Creat: 1.02 mg/dL (ref 0.60–1.35)
GFR, Est African American: 102 mL/min/{1.73_m2} (ref >=60)
GFR, Est Non African American: 88 mL/min/{1.73_m2} (ref >=60)
Globulin: 3 g/dL (calc) (ref 1.9–3.7)
Glucose, Bld: 279 mg/dL — ABNORMAL HIGH (ref 65–99)
Potassium: 4.4 mmol/L (ref 3.5–5.3)
Sodium: 136 mmol/L (ref 135–146)
Total Bilirubin: 0.9 mg/dL (ref 0.2–1.2)
Total Protein: 7.3 g/dL (ref 6.1–8.1)

## 2020-08-06 LAB — LIPID PANEL
Cholesterol: 180 mg/dL (ref <200)
HDL: 23 mg/dL — ABNORMAL LOW (ref >=40)
Non-HDL Cholesterol (Calc): 157 mg/dL (calc) — ABNORMAL HIGH (ref <130)
Total CHOL/HDL Ratio: 7.8 (calc) — ABNORMAL HIGH (ref <5.0)
Triglycerides: 682 mg/dL — ABNORMAL HIGH (ref <150)

## 2020-08-06 LAB — RPR: RPR Ser Ql: NONREACTIVE

## 2020-08-06 LAB — HIV-1 RNA QUANT-NO REFLEX-BLD
HIV 1 RNA Quant: <20 Copies/mL
HIV-1 RNA Quant, Log: <1.3 Log cps/mL

## 2020-08-16 MED FILL — BIKTARVY 50-200-25 MG TABS: 50-200-25 | 30 days supply | Qty: 30 | Fill #4

## 2020-08-17 ENCOUNTER — Encounter (INDEPENDENT_AMBULATORY_CARE_PROVIDER_SITE_OTHER): Payer: Managed Care, Other (non HMO) | Admitting: *Deleted

## 2020-08-17 ENCOUNTER — Other Ambulatory Visit: Payer: Self-pay | Admitting: Infectious Disease

## 2020-08-17 ENCOUNTER — Telehealth (INDEPENDENT_AMBULATORY_CARE_PROVIDER_SITE_OTHER): Payer: Managed Care, Other (non HMO) | Admitting: Infectious Disease

## 2020-08-17 ENCOUNTER — Other Ambulatory Visit: Payer: Self-pay

## 2020-08-17 DIAGNOSIS — K760 Fatty (change of) liver, not elsewhere classified: Secondary | ICD-10-CM

## 2020-08-17 DIAGNOSIS — B009 Herpesviral infection, unspecified: Secondary | ICD-10-CM | POA: Diagnosis not present

## 2020-08-17 DIAGNOSIS — B20 Human immunodeficiency virus [HIV] disease: Secondary | ICD-10-CM

## 2020-08-17 DIAGNOSIS — E669 Obesity, unspecified: Secondary | ICD-10-CM

## 2020-08-17 DIAGNOSIS — R7303 Prediabetes: Secondary | ICD-10-CM

## 2020-08-17 DIAGNOSIS — Z202 Contact with and (suspected) exposure to infections with a predominantly sexual mode of transmission: Secondary | ICD-10-CM

## 2020-08-17 DIAGNOSIS — Z006 Encounter for examination for normal comparison and control in clinical research program: Secondary | ICD-10-CM

## 2020-08-17 DIAGNOSIS — Z6834 Body mass index (BMI) 34.0-34.9, adult: Secondary | ICD-10-CM

## 2020-08-17 MED ORDER — BIKTARVY 50-200-25 MG PO TABS: 1.0000 | ORAL_TABLET | Freq: Every day | ORAL | 11 refills | Status: DC

## 2020-08-17 MED ORDER — VALACYCLOVIR HCL 1 G PO TABS: ORAL_TABLET | ORAL | 11 refills | Status: DC

## 2020-08-17 MED ORDER — VALACYCLOVIR HCL 500 MG PO TABS: 500.0000 mg | ORAL_TABLET | Freq: Every day | ORAL | 5 refills | Status: DC

## 2020-08-17 MED FILL — valACYclovir HCL 1 GM TABS: 1 | 30 days supply | Qty: 44 | Fill #0

## 2020-08-17 NOTE — Progress Notes (Signed)
Virtual Visit via Telephone Note  I connected with Timothy Perry on 08/17/20 at  2:30 PM EST by telephone and verified that I am speaking with the correct person using two identifiers.  Location: Patient: Home Provider: [facility]   I discussed the limitations, risks, security and privacy concerns of performing an evaluation and management service by telephone and the availability of in person appointments. I also discussed with the patient that there may be a patient responsible charge related to this service. The patient expressed understanding and agreed to proceed.   History of Present Illness:  45 year old Caucasian man living with HIV that is currently well controlled on Biktarvy. He has comorbid obesity, NAFLD (bx at [facility] in 2015) pre-daibetes, HSV  We went over his transaminases but he also pointed out that they have varied in this range over past 10 years at times worse and at times better  His PCP is also monitoring this.  I also offered to have him see hepatology again including in our buidling.  He is taking valtrex 500mg  prophylactically but still having fair frequency of HSV 2 breakouts on scalp and nose.      Past Medical History:  Diagnosis Date  . Fatty liver 02/11/2020  . Herpes infection 02/11/2020  . HIV (human immunodeficiency virus infection) ([facility]) 03/26/2009   [facility];  participant in START Study  . Priapism    occasional, mornings  . Substance abuse ([facility])   . Vesicular rash 01/08/2019    No past surgical history on file.  Family History  Problem Relation Age of Onset  . Thyroid disease Mother   . Asthma Brother       Social History   Socioeconomic History  . Marital status: Single    Spouse name: [name]  . Number of children: 0  . Years of education: 29  . Highest education level: Not on file  Occupational History  . Occupation: Substance Abuse Counselor   Tobacco Use  . Smoking status: Current Every Day Smoker    Packs/day: 0.20    Years:  22.00    Pack years: 4.40    Types: E-cigarettes  . Smokeless tobacco: Never Used  . Tobacco comment: electronic cig  Substance and Sexual Activity  . Alcohol use: Yes    Alcohol/week: 0.0 - 2.0 standard drinks    Comment: social  . Drug use: Not Currently    Types: Marijuana    Comment: Rarely   . Sexual activity: Yes    Partners: Male    Comment: inconsistent condom use  Other Topics Concern  . Not on file  Social History Narrative   Engaged.  Just bought a house together.   Social Determinants of Health   Financial Resource Strain: Not on file  Food Insecurity: Not on file  Transportation Needs: Not on file  Physical Activity: Not on file  Stress: Not on file  Social Connections: Not on file    No Known Allergies   Current Outpatient Medications:  .  valACYclovir (VALTREX) 1000 MG tablet, One tablet daily then one twice daily with HSV outbreak, Disp: 44 tablet, Rfl: 11 .  bictegravir-emtricitabine-tenofovir AF (BIKTARVY) 50-200-25 MG TABS tablet, Take 1 tablet by mouth daily., Disp: 30 tablet, Rfl: 11 .  cyclobenzaprine (FLEXERIL) 10 MG tablet, Take 1 tablet (10 mg total) by mouth 3 (three) times daily as needed for muscle spasms., Disp: 30 tablet, Rfl: 0 .  lisinopril (ZESTRIL) 10 MG tablet, TAKE 1 TABLET (10 MG TOTAL) BY  MOUTH DAILY., Disp: 30 tablet, Rfl: 0 .  Naproxen Sodium (ALEVE) 220 MG CAPS, Take by mouth.  , Disp: , Rfl:  .  Study - REPRIEVE [phone] - pitavastatin 4 mg or placebo tablet, Take 1 tablet (4 mg total) by mouth daily., Disp: , Rfl:   Current Facility-Administered Medications:  .  methylPREDNISolone acetate (DEPO-MEDROL) injection 80 mg, 80 mg, Intramuscular, Once, Carmelina Dane, MD    Observations/Objective:  Timothy Perry was in good spirits and seemed well over the phone  Assessment and Plan:  HIV disease: continue Biktarvy  HSV: increase prophylactic dose to 1 gram daily and then BID when he has flares  NAFLD: continue to monitor and refer  to hepatology if need be  Obesity: he will try diet and exercise  Pre-diabetes: can solve with weight loss  Follow Up Instructions:    I discussed the assessment and treatment plan with the patient. The patient was provided an opportunity to ask questions and all were answered. The patient agreed with the plan and demonstrated an understanding of the instructions.   The patient was advised to call back or seek an in-person evaluation if the symptoms worsen or if the condition fails to improve as anticipated.  I provided 21 minutes of non-face-to-face time during this encounter.   Acey Lav, MD

## 2020-08-17 NOTE — Research (Signed)
Cleopatra Cedar was here today for the Reprieve study month 76. He denies any new problems but did note elevated glucose on his labs from 2 weeks ago. He said he was told he is prediabetic. His LFTs continue to go up and down and is being followed by PCP for that. He says his adherence is excellent with this study meds and his ARVs. He will be returning in 4 months and had a video visit with Dr. Daiva Eves today.

## 2020-09-13 MED FILL — BIKTARVY 50-200-25 MG TABS: 50-200-25 | 30 days supply | Qty: 30 | Fill #0

## 2020-09-13 MED FILL — valACYclovir HCL 1 GM TABS: 1 | 30 days supply | Qty: 44 | Fill #1

## 2020-09-23 ENCOUNTER — Other Ambulatory Visit (HOSPITAL_COMMUNITY): Payer: Self-pay

## 2020-10-12 ENCOUNTER — Other Ambulatory Visit (HOSPITAL_COMMUNITY): Payer: Self-pay

## 2020-10-15 ENCOUNTER — Other Ambulatory Visit (HOSPITAL_COMMUNITY): Payer: Self-pay

## 2020-11-09 ENCOUNTER — Other Ambulatory Visit (HOSPITAL_COMMUNITY): Payer: Self-pay

## 2020-11-09 MED FILL — Bictegravir-Emtricitabine-Tenofovir AF Tab 50-200-25 MG: ORAL | 30 days supply | Qty: 30 | Fill #0 | Status: AC

## 2020-11-13 ENCOUNTER — Other Ambulatory Visit (HOSPITAL_COMMUNITY): Payer: Self-pay

## 2020-11-15 ENCOUNTER — Other Ambulatory Visit (HOSPITAL_COMMUNITY): Payer: Self-pay

## 2020-12-15 ENCOUNTER — Other Ambulatory Visit (HOSPITAL_COMMUNITY): Payer: Self-pay

## 2020-12-21 ENCOUNTER — Other Ambulatory Visit (HOSPITAL_COMMUNITY): Payer: Self-pay

## 2020-12-21 MED FILL — Valacyclovir HCl Tab 1 GM: ORAL | 30 days supply | Qty: 44 | Fill #0 | Status: AC

## 2020-12-21 MED FILL — Bictegravir-Emtricitabine-Tenofovir AF Tab 50-200-25 MG: ORAL | 30 days supply | Qty: 30 | Fill #1 | Status: AC

## 2020-12-22 ENCOUNTER — Other Ambulatory Visit (HOSPITAL_COMMUNITY): Payer: Self-pay

## 2020-12-30 ENCOUNTER — Other Ambulatory Visit: Payer: Self-pay | Admitting: Physician Assistant

## 2020-12-30 ENCOUNTER — Ambulatory Visit
Admission: RE | Admit: 2020-12-30 | Discharge: 2020-12-30 | Disposition: A | Discharge status: Home / Self Care | Payer: Managed Care, Other (non HMO) | Source: Ambulatory Visit | Attending: Physician Assistant | Admitting: Physician Assistant

## 2020-12-30 DIAGNOSIS — M25561 Pain in right knee: Secondary | ICD-10-CM

## 2021-01-06 ENCOUNTER — Other Ambulatory Visit: Payer: Self-pay

## 2021-01-06 DIAGNOSIS — B20 Human immunodeficiency virus [HIV] disease: Secondary | ICD-10-CM

## 2021-01-10 ENCOUNTER — Other Ambulatory Visit: Payer: Self-pay

## 2021-01-10 ENCOUNTER — Other Ambulatory Visit: Payer: Managed Care, Other (non HMO)

## 2021-01-10 DIAGNOSIS — B20 Human immunodeficiency virus [HIV] disease: Secondary | ICD-10-CM

## 2021-01-11 LAB — T-HELPER CELL (CD4) - (RCID CLINIC ONLY)
CD4 % Helper T Cell: 55 % (ref 33–65)
CD4 T Cell Abs: 1005 /uL (ref 400–1790)

## 2021-01-12 LAB — HIV-1 RNA QUANT-NO REFLEX-BLD
HIV 1 RNA Quant: NOT DETECTED Copies/mL
HIV-1 RNA Quant, Log: NOT DETECTED Log cps/mL

## 2021-01-12 LAB — COMPLETE METABOLIC PANEL WITH GFR
AG Ratio: 1.6 (calc) (ref 1.0–2.5)
ALT: 138 U/L — ABNORMAL HIGH (ref 9–46)
AST: 90 U/L — ABNORMAL HIGH (ref 10–40)
Albumin: 4.2 g/dL (ref 3.6–5.1)
Alkaline phosphatase (APISO): 68 U/L (ref 36–130)
BUN: 9 mg/dL (ref 7–25)
CO2: 27 mmol/L (ref 20–32)
Calcium: 9.3 mg/dL (ref 8.6–10.3)
Chloride: 103 mmol/L (ref 98–110)
Creat: 1.01 mg/dL (ref 0.60–1.29)
Globulin: 2.7 g/dL (calc) (ref 1.9–3.7)
Glucose, Bld: 169 mg/dL — ABNORMAL HIGH (ref 65–99)
Potassium: 4 mmol/L (ref 3.5–5.3)
Sodium: 138 mmol/L (ref 135–146)
Total Bilirubin: 1.2 mg/dL (ref 0.2–1.2)
Total Protein: 6.9 g/dL (ref 6.1–8.1)
eGFR: 92 mL/min/{1.73_m2} (ref >=60)

## 2021-01-12 LAB — CBC WITH DIFFERENTIAL/PLATELET
Absolute Monocytes: 355 cells/uL (ref 200–950)
Basophils Absolute: 53 cells/uL (ref 0–200)
Basophils Relative: 1 %
Eosinophils Absolute: 111 cells/uL (ref 15–500)
Eosinophils Relative: 2.1 %
HCT: 41.9 % (ref 38.5–50.0)
Hemoglobin: 14.4 g/dL (ref 13.2–17.1)
Lymphs Abs: 1919 cells/uL (ref 850–3900)
MCH: 33.5 pg — ABNORMAL HIGH (ref 27.0–33.0)
MCHC: 34.4 g/dL (ref 32.0–36.0)
MCV: 97.4 fL (ref 80.0–100.0)
MPV: 10.6 fL (ref 7.5–12.5)
Monocytes Relative: 6.7 %
Neutro Abs: 2862 cells/uL (ref 1500–7800)
Neutrophils Relative %: 54 %
Platelets: 172 10*3/uL (ref 140–400)
RBC: 4.3 10*6/uL (ref 4.20–5.80)
RDW: 12.1 % (ref 11.0–15.0)
Total Lymphocyte: 36.2 %
WBC: 5.3 10*3/uL (ref 3.8–10.8)

## 2021-01-13 ENCOUNTER — Ambulatory Visit: Payer: Managed Care, Other (non HMO)

## 2021-01-19 ENCOUNTER — Other Ambulatory Visit (HOSPITAL_COMMUNITY): Payer: Self-pay

## 2021-01-20 ENCOUNTER — Ambulatory Visit: Payer: Managed Care, Other (non HMO)

## 2021-01-24 ENCOUNTER — Encounter (INDEPENDENT_AMBULATORY_CARE_PROVIDER_SITE_OTHER): Payer: Self-pay | Admitting: *Deleted

## 2021-01-24 ENCOUNTER — Other Ambulatory Visit: Payer: Self-pay

## 2021-01-24 ENCOUNTER — Ambulatory Visit: Payer: Managed Care, Other (non HMO) | Admitting: Infectious Disease

## 2021-01-24 ENCOUNTER — Encounter: Payer: Self-pay | Admitting: Infectious Disease

## 2021-01-24 ENCOUNTER — Other Ambulatory Visit (HOSPITAL_COMMUNITY): Payer: Self-pay

## 2021-01-24 VITALS — BP 109/77 | HR 88 | Temp 98.1°F | Wt 258.0 lb

## 2021-01-24 DIAGNOSIS — R7401 Elevation of levels of liver transaminase levels: Secondary | ICD-10-CM | POA: Diagnosis not present

## 2021-01-24 DIAGNOSIS — I10 Essential (primary) hypertension: Secondary | ICD-10-CM

## 2021-01-24 DIAGNOSIS — B20 Human immunodeficiency virus [HIV] disease: Secondary | ICD-10-CM

## 2021-01-24 DIAGNOSIS — F32A Depression, unspecified: Secondary | ICD-10-CM

## 2021-01-24 DIAGNOSIS — F419 Anxiety disorder, unspecified: Secondary | ICD-10-CM | POA: Diagnosis not present

## 2021-01-24 DIAGNOSIS — K760 Fatty (change of) liver, not elsewhere classified: Secondary | ICD-10-CM

## 2021-01-24 DIAGNOSIS — Z006 Encounter for examination for normal comparison and control in clinical research program: Secondary | ICD-10-CM

## 2021-01-24 DIAGNOSIS — E1165 Type 2 diabetes mellitus with hyperglycemia: Secondary | ICD-10-CM

## 2021-01-24 HISTORY — DX: Type 2 diabetes mellitus with hyperglycemia: E11.65

## 2021-01-24 HISTORY — DX: Essential (primary) hypertension: I10

## 2021-01-24 MED ORDER — BICTEGRAVIR-EMTRICITAB-TENOFOV 50-200-25 MG PO TABS
1.0000 | ORAL_TABLET | Freq: Every day | ORAL | 11 refills | Status: DC
  Filled 2021-01-24: qty 30, 30d supply, fill #0
  Filled 2021-02-23: qty 30, 30d supply, fill #1
  Filled 2021-04-04: qty 30, 30d supply, fill #2
  Filled 2021-04-28: qty 30, 30d supply, fill #3
  Filled 2021-05-25: qty 30, 30d supply, fill #4
  Filled 2021-06-23: qty 30, 30d supply, fill #5
  Filled 2021-07-21: qty 30, 30d supply, fill #6

## 2021-01-24 MED ORDER — VALACYCLOVIR HCL 1 G PO TABS
ORAL_TABLET | ORAL | 11 refills | Status: DC
  Filled 2021-01-24: qty 44, 30d supply, fill #0
  Filled 2021-03-03: qty 44, 30d supply, fill #1
  Filled 2021-04-04: qty 44, 30d supply, fill #2
  Filled 2021-04-28: qty 44, 30d supply, fill #3
  Filled 2021-05-25: qty 44, 30d supply, fill #4
  Filled 2021-06-23: qty 44, 30d supply, fill #5

## 2021-01-24 NOTE — Progress Notes (Signed)
Subjective:  Chief complaint: followup for HIV disease on medications   Patient ID: Timothy Perry, male    DOB: 07/22/1975, 45 y.o.   MRN: [id number]  HPI  Timothy Perry is a 45 year old Caucasian man living with HIV perfectly controled on Biktarvy (previously on Atripla.  He has been dx with Diabetes and on metforming 584m bid now and a statin.  He is still in [place] not receiving study medications. He is on valtrex for HSV prevention and treatment.     Past Medical History:  Diagnosis Date   Fatty liver 02/11/2020   Herpes infection 02/11/2020   HIV (human immunodeficiency virus infection) ([place] 03/26/2009   [facility];  participant in START Study   Priapism    occasional, mornings   Substance abuse ([place]    Vesicular rash 01/08/2019    No past surgical history on file.  Family History  Problem Relation Age of Onset   Thyroid disease Mother    Asthma Brother       Social History   Socioeconomic History   Marital status: Single    Spouse name: [name]   Number of children: 0   Years of education: 15   Highest education level: Not on file  Occupational History   Occupation: Substance Abuse Counselor   Tobacco Use   Smoking status: Every Day    Packs/day: 0.20    Years: 22.00    Pack years: 4.40    Types: E-cigarettes, Cigarettes   Smokeless tobacco: Never   Tobacco comments:    electronic cig  Substance and Sexual Activity   Alcohol use: Yes    Alcohol/week: 0.0 - 2.0 standard drinks    Comment: social   Drug use: Not Currently    Types: Marijuana    Comment: Rarely    Sexual activity: Yes    Partners: Male    Comment: declined condoms  Other Topics Concern   Not on file  Social History Narrative   Engaged.  Just bought a house together.   Social Determinants of Health   Financial Resource Strain: Not on file  Food Insecurity: Not on file  Transportation Needs: Not on file  Physical Activity: Not on file  Stress: Not on file  Social Connections:  Not on file    No Known Allergies   Current Outpatient Medications:    Accu-Chek Softclix Lancets lancets, daily., Disp: , Rfl:    bictegravir-emtricitabine-tenofovir AF (BIKTARVY) 50-200-25 MG TABS tablet, TAKE 1 TABLET BY MOUTH DAILY., Disp: 30 tablet, Rfl: 11   lisinopril (ZESTRIL) 10 MG tablet, TAKE 1 TABLET (10 MG TOTAL) BY MOUTH DAILY., Disp: 30 tablet, Rfl: 0   Naproxen Sodium 220 MG CAPS, Take by mouth.  , Disp: , Rfl:    valACYclovir (VALTREX) 1000 MG tablet, TAKE 1 TABLET BY MOUTH DAILY. THEN TAKE 1 TWICE DAILY WITH HSV OUTBREAK, Disp: 44 tablet, Rfl: 11   ACCU-CHEK GUIDE test strip, AS DIRECTED IN VITRO ONCE A DAY (DX: E11.69) 90 DAYS, Disp: , Rfl:    atorvastatin (LIPITOR) 20 MG tablet, Take 20 mg by mouth daily., Disp: , Rfl:    Blood Glucose Monitoring Suppl (ACCU-CHEK GUIDE ME) w/Device KIT, See admin instructions., Disp: , Rfl:    cyclobenzaprine (FLEXERIL) 10 MG tablet, Take 1 tablet (10 mg total) by mouth 3 (three) times daily as needed for muscle spasms. (Patient not taking: Reported on 01/24/2021), Disp: 30 tablet, Rfl: 0   metFORMIN (GLUCOPHAGE) 500 MG tablet, Take 500 mg by  mouth in the morning and at bedtime., Disp: , Rfl:    Study - REPRIEVE [phone] - pitavastatin 4 mg or placebo tablet, Take 1 tablet (4 mg total) by mouth daily. (Patient not taking: Reported on 01/24/2021), Disp: , Rfl:   Current Facility-Administered Medications:    methylPREDNISolone acetate (DEPO-MEDROL) injection 80 mg, 80 mg, Intramuscular, Once, Roselee Culver, MD   Review of Systems  Constitutional:  Negative for chills and fever.  HENT:  Negative for congestion and sore throat.   Eyes:  Negative for photophobia, pain and redness.  Respiratory:  Negative for cough, shortness of breath and wheezing.   Cardiovascular:  Negative for chest pain, palpitations and leg swelling.  Gastrointestinal:  Negative for abdominal pain, blood in stool, constipation, diarrhea, nausea and vomiting.   Genitourinary:  Negative for dysuria, flank pain and hematuria.  Musculoskeletal:  Negative for back pain and myalgias.  Skin:  Negative for color change and rash.  Neurological:  Negative for dizziness, tremors, syncope, speech difficulty, weakness, numbness and headaches.  Hematological:  Does not bruise/bleed easily.  Psychiatric/Behavioral:  Negative for agitation, confusion, self-injury, sleep disturbance and suicidal ideas. The patient is not nervous/anxious and is not hyperactive.       Objective:   Physical Exam Constitutional:      General: He is not in acute distress.    Appearance: Normal appearance. He is well-developed. He is not ill-appearing or diaphoretic.  HENT:     Head: Normocephalic and atraumatic.     Right Ear: Hearing and external ear normal.     Left Ear: Hearing and external ear normal.     Nose: No nasal deformity or rhinorrhea.  Eyes:     General: No scleral icterus.    Extraocular Movements: Extraocular movements intact.     Conjunctiva/sclera: Conjunctivae normal.     Right eye: Right conjunctiva is not injected.     Left eye: Left conjunctiva is not injected.  Neck:     Vascular: No JVD.  Cardiovascular:     Rate and Rhythm: Normal rate and regular rhythm.     Heart sounds: S1 normal and S2 normal.  Pulmonary:     Effort: Pulmonary effort is normal. No respiratory distress.     Breath sounds: No wheezing.  Abdominal:     General: There is no distension.     Palpations: Abdomen is soft.  Musculoskeletal:        General: Normal range of motion.     Right shoulder: Normal.     Left shoulder: Normal.     Cervical back: Normal range of motion and neck supple.     Right hip: Normal.     Left hip: Normal.     Right knee: Normal.     Left knee: Normal.  Lymphadenopathy:     Head:     Right side of head: No submandibular, preauricular or posterior auricular adenopathy.     Left side of head: No submandibular, preauricular or posterior auricular  adenopathy.     Cervical: No cervical adenopathy.     Right cervical: No superficial or deep cervical adenopathy.    Left cervical: No superficial or deep cervical adenopathy.  Skin:    General: Skin is warm and dry.     Coloration: Skin is not pale.     Findings: No abrasion, bruising, ecchymosis, erythema, lesion or rash.     Nails: There is no clubbing.  Neurological:     Mental Status: He is alert  and oriented to person, place, and time.     Sensory: No sensory deficit.     Coordination: Coordination normal.     Gait: Gait normal.  Psychiatric:        Attention and Perception: He is attentive.        Speech: Speech normal.        Behavior: Behavior normal. Behavior is cooperative.        Thought Content: Thought content normal.        Judgment: Judgment normal.          Assessment & Plan:  HIV disease: viral load from July 18th 2022 reviewed and not detecte CD4 reviewed and 1005 from same date. Serum cr reviewed and =  1.01 from same date  Biktarvy rx sent in  Chronic HSV infection: sent valtrex in for prevention and enough for treatment as well Cr reviewed and is 1.01  DM: currently  on metformin 500 mg bid and discussed potential for bictegravir to increase metfomrin levels but that if he did not have toxicity from diarrhea fine to have PCP push dose to 1g bid  HTN:  Today's Vitals   01/24/21 1432  BP: 109/77  Pulse: 88  Temp: 98.1 F (36.7 C)  TempSrc: Oral  Weight: 258 lb (117 kg)   Body mass index is 35.98 kg/m. BP well controlled   Fatty liver disease: LFTS from July 18th,, 2022 reviewed and stable at AST ALST of 90 and 138 when I reviewed them  I will order [place] of liver given he could have cirrhosis and risk for [facility] if this is the case.

## 2021-01-24 NOTE — Research (Signed)
Timothy Perry was here for his month 80 visit for Reprieve. He was recently diagnosed with Diabetes Mellitus Type 2 by his PCP and started on metformin. He also started lipitor at the same visit with her and did stop his Pitavastatin at the same time. He said he has lost a little bit of weight recently and is trying to lose more. He saw  today Dr. Daiva Eves also and he  has ordered an abd ultrasound to followup on his NASH. He will return for study in November.

## 2021-01-24 NOTE — Addendum Note (Signed)
Addended by: Phill Myron on: 01/24/2021 03:53 PM   Modules accepted: Orders

## 2021-01-27 ENCOUNTER — Ambulatory Visit: Payer: Managed Care, Other (non HMO)

## 2021-02-02 ENCOUNTER — Ambulatory Visit
Admission: RE | Admit: 2021-02-02 | Discharge: 2021-02-02 | Disposition: A | Discharge status: Home / Self Care | Payer: Managed Care, Other (non HMO) | Source: Ambulatory Visit | Attending: Infectious Disease | Admitting: Infectious Disease

## 2021-02-02 DIAGNOSIS — K760 Fatty (change of) liver, not elsewhere classified: Secondary | ICD-10-CM

## 2021-02-02 DIAGNOSIS — B20 Human immunodeficiency virus [HIV] disease: Secondary | ICD-10-CM

## 2021-02-03 ENCOUNTER — Encounter: Payer: Managed Care, Other (non HMO) | Attending: Physician Assistant

## 2021-02-08 ENCOUNTER — Ambulatory Visit: Payer: Managed Care, Other (non HMO)

## 2021-02-17 ENCOUNTER — Ambulatory Visit: Payer: Managed Care, Other (non HMO)

## 2021-02-23 ENCOUNTER — Other Ambulatory Visit (HOSPITAL_COMMUNITY): Payer: Self-pay

## 2021-03-02 ENCOUNTER — Other Ambulatory Visit (HOSPITAL_COMMUNITY): Payer: Self-pay

## 2021-03-03 ENCOUNTER — Other Ambulatory Visit (HOSPITAL_COMMUNITY): Payer: Self-pay

## 2021-03-04 ENCOUNTER — Other Ambulatory Visit: Payer: Self-pay

## 2021-03-04 ENCOUNTER — Ambulatory Visit (INDEPENDENT_AMBULATORY_CARE_PROVIDER_SITE_OTHER): Payer: Managed Care, Other (non HMO)

## 2021-03-04 DIAGNOSIS — Z23 Encounter for immunization: Secondary | ICD-10-CM

## 2021-03-04 NOTE — Progress Notes (Signed)
    [facility]  Name:  Timothy Perry    MRN: [id number] DOB: Dec 07, 1975   03/04/2021  [name] was observed post JYNNEOS immunization for 15 minutes without incident. He was provided with Vaccine Information Sheet and instruction to access the V-Safe system.   [name] was instructed to call 911 with any severe reactions post vaccine: Difficulty breathing  Swelling of face and throat  A fast heartbeat  A bad rash all over body  Dizziness and weakness

## 2021-03-28 ENCOUNTER — Other Ambulatory Visit (HOSPITAL_COMMUNITY): Payer: Self-pay

## 2021-04-04 ENCOUNTER — Other Ambulatory Visit (HOSPITAL_COMMUNITY): Payer: Self-pay

## 2021-04-15 ENCOUNTER — Ambulatory Visit: Payer: Managed Care, Other (non HMO)

## 2021-04-28 ENCOUNTER — Other Ambulatory Visit (HOSPITAL_COMMUNITY): Payer: Self-pay

## 2021-05-05 ENCOUNTER — Ambulatory Visit (INDEPENDENT_AMBULATORY_CARE_PROVIDER_SITE_OTHER): Payer: Managed Care, Other (non HMO)

## 2021-05-05 ENCOUNTER — Other Ambulatory Visit (HOSPITAL_COMMUNITY): Payer: Self-pay

## 2021-05-05 ENCOUNTER — Encounter (INDEPENDENT_AMBULATORY_CARE_PROVIDER_SITE_OTHER): Payer: Managed Care, Other (non HMO) | Admitting: *Deleted

## 2021-05-05 ENCOUNTER — Other Ambulatory Visit: Payer: Self-pay

## 2021-05-05 VITALS — BP 112/75 | HR 72 | Temp 97.6°F | Wt 261.0 lb

## 2021-05-05 DIAGNOSIS — Z006 Encounter for examination for normal comparison and control in clinical research program: Secondary | ICD-10-CM

## 2021-05-05 DIAGNOSIS — Z23 Encounter for immunization: Secondary | ICD-10-CM

## 2021-05-05 NOTE — Research (Signed)
Tod seen today for his Month 59 Reprieve visit. No new complaints or medications. He is currently being followed on study/off study drug. He is scheduled to return in February to see Dr. Daiva Eves and for his next study visit.

## 2021-05-25 ENCOUNTER — Other Ambulatory Visit (HOSPITAL_COMMUNITY): Payer: Self-pay

## 2021-06-01 ENCOUNTER — Other Ambulatory Visit (HOSPITAL_COMMUNITY): Payer: Self-pay

## 2021-06-23 ENCOUNTER — Other Ambulatory Visit (HOSPITAL_COMMUNITY): Payer: Self-pay

## 2021-06-28 ENCOUNTER — Telehealth: Payer: Self-pay

## 2021-06-28 ENCOUNTER — Other Ambulatory Visit (HOSPITAL_COMMUNITY): Payer: Self-pay

## 2021-06-28 NOTE — Telephone Encounter (Signed)
[facility] Patient Advocate Encounter  Completed and sent [employer] Advancing Access application for [place] for this patient who is uninsured.    Patient is approved 06/28/21.       Ileene Patrick, [facility] Patient [facility] Phone: [phone] Fax:  [phone]

## 2021-07-05 ENCOUNTER — Other Ambulatory Visit: Payer: Self-pay

## 2021-07-05 ENCOUNTER — Encounter: Payer: Self-pay | Admitting: Infectious Disease

## 2021-07-05 ENCOUNTER — Other Ambulatory Visit (HOSPITAL_COMMUNITY): Payer: Self-pay

## 2021-07-05 MED ORDER — VALACYCLOVIR HCL 1 G PO TABS: ORAL_TABLET | ORAL | 5 refills | Status: DC

## 2021-07-21 ENCOUNTER — Other Ambulatory Visit (HOSPITAL_COMMUNITY): Payer: Self-pay

## 2021-07-27 ENCOUNTER — Other Ambulatory Visit (HOSPITAL_COMMUNITY): Payer: Self-pay

## 2021-07-27 ENCOUNTER — Other Ambulatory Visit: Payer: Managed Care, Other (non HMO)

## 2021-07-28 ENCOUNTER — Other Ambulatory Visit: Payer: Self-pay | Admitting: Infectious Disease

## 2021-07-28 ENCOUNTER — Other Ambulatory Visit: Payer: BC Managed Care – PPO

## 2021-07-28 ENCOUNTER — Other Ambulatory Visit: Payer: Self-pay

## 2021-07-28 DIAGNOSIS — K760 Fatty (change of) liver, not elsewhere classified: Secondary | ICD-10-CM | POA: Diagnosis not present

## 2021-07-28 DIAGNOSIS — B20 Human immunodeficiency virus [HIV] disease: Secondary | ICD-10-CM

## 2021-08-01 LAB — CBC WITH DIFFERENTIAL/PLATELET
Absolute Monocytes: 399 cells/uL (ref 200–950)
Basophils Absolute: 40 cells/uL (ref 0–200)
Basophils Relative: 0.7 %
Eosinophils Absolute: 68 cells/uL (ref 15–500)
Eosinophils Relative: 1.2 %
HCT: 43.9 % (ref 38.5–50.0)
Hemoglobin: 15 g/dL (ref 13.2–17.1)
Lymphs Abs: 1602 cells/uL (ref 850–3900)
MCH: 32.3 pg (ref 27.0–33.0)
MCHC: 34.2 g/dL (ref 32.0–36.0)
MCV: 94.6 fL (ref 80.0–100.0)
MPV: 10.6 fL (ref 7.5–12.5)
Monocytes Relative: 7 %
Neutro Abs: 3591 cells/uL (ref 1500–7800)
Neutrophils Relative %: 63 %
Platelets: 189 10*3/uL (ref 140–400)
RBC: 4.64 10*6/uL (ref 4.20–5.80)
RDW: 13.2 % (ref 11.0–15.0)
Total Lymphocyte: 28.1 %
WBC: 5.7 10*3/uL (ref 3.8–10.8)

## 2021-08-01 LAB — LIPID PANEL
Cholesterol: 107 mg/dL (ref <200)
HDL: 29 mg/dL — ABNORMAL LOW (ref >=40)
Non-HDL Cholesterol (Calc): 78 mg/dL (calc) (ref <130)
Total CHOL/HDL Ratio: 3.7 (calc) (ref <5.0)
Triglycerides: 454 mg/dL — ABNORMAL HIGH (ref <150)

## 2021-08-01 LAB — COMPLETE METABOLIC PANEL WITH GFR
AG Ratio: 1.6 (calc) (ref 1.0–2.5)
ALT: 69 U/L — ABNORMAL HIGH (ref 9–46)
AST: 41 U/L — ABNORMAL HIGH (ref 10–40)
Albumin: 4.5 g/dL (ref 3.6–5.1)
Alkaline phosphatase (APISO): 87 U/L (ref 36–130)
BUN: 11 mg/dL (ref 7–25)
CO2: 24 mmol/L (ref 20–32)
Calcium: 9.7 mg/dL (ref 8.6–10.3)
Chloride: 103 mmol/L (ref 98–110)
Creat: 1.15 mg/dL (ref 0.60–1.29)
Globulin: 2.8 g/dL (calc) (ref 1.9–3.7)
Glucose, Bld: 226 mg/dL — ABNORMAL HIGH (ref 65–99)
Potassium: 4.3 mmol/L (ref 3.5–5.3)
Sodium: 138 mmol/L (ref 135–146)
Total Bilirubin: 1 mg/dL (ref 0.2–1.2)
Total Protein: 7.3 g/dL (ref 6.1–8.1)
eGFR: 79 mL/min/{1.73_m2} (ref >=60)

## 2021-08-01 LAB — C. TRACHOMATIS/N. GONORRHOEAE RNA
C. trachomatis RNA, TMA: NOT DETECTED
N. gonorrhoeae RNA, TMA: NOT DETECTED

## 2021-08-01 LAB — HIV-1 RNA QUANT-NO REFLEX-BLD
HIV 1 RNA Quant: NOT DETECTED Copies/mL
HIV-1 RNA Quant, Log: NOT DETECTED Log cps/mL

## 2021-08-01 LAB — T-HELPER CELLS (CD4) COUNT (NOT AT ARMC)
Absolute CD4: 820 cells/uL (ref 490–1740)
CD4 T Helper %: 55 % (ref 30–61)
Total lymphocyte count: 1498 cells/uL (ref 850–3900)

## 2021-08-01 LAB — RPR: RPR Ser Ql: NONREACTIVE

## 2021-08-09 NOTE — Progress Notes (Signed)
Subjective:  Chief complaint: Follow-up for HIV disease on medications   Patient ID: Timothy Perry, male    DOB: 10-Mar-1976, 46 y.o.   MRN: [id number]  HPI  Timothy Perry is a 46 year old Caucasian man living with HIV perfectly controled on Biktarvy (previously on Atripla.  He has been dx with Diabetes and on metforming 573m bid now and a statin.  He is still in [place] not receiving study medications. He is on valtrex for HSV prevention and treatment.  He is seeing NClaudie Leachfor primary care.  [place]       Past Medical History:  Diagnosis Date   Fatty liver 02/11/2020   Herpes infection 02/11/2020   HIV [place] immunodeficiency virus infection) [place] 03/26/2009   [facility];  participant in START Study   Hypertension 01/24/2021   Priapism    occasional, mornings   Substance abuse ([place]    Type 2 diabetes mellitus with hyperglycemia ([place] 01/24/2021   Vesicular rash 01/08/2019    No past surgical history on file.  Family History  Problem Relation Age of Onset   Thyroid disease Mother    Asthma Brother       Social History   Socioeconomic History   Marital status: Single    Spouse name: [name]   Number of children: 0   Years of education: 15   Highest education level: Not on file  Occupational History   Occupation: Substance Abuse Counselor   Tobacco Use   Smoking status: Every Day    Packs/day: 0.20    Years: 22.00    Pack years: 4.40    Types: E-cigarettes, Cigarettes   Smokeless tobacco: Never   Tobacco comments:    electronic cig  Substance and Sexual Activity   Alcohol use: Yes    Alcohol/week: 0.0 - 2.0 standard drinks    Comment: social   Drug use: Not Currently    Types: Marijuana    Comment: Rarely    Sexual activity: Yes    Partners: Male    Comment: declined condoms  Other Topics Concern   Not on file  Social History Narrative   Engaged.  Just bought a house together.   Social  Determinants of Health   Financial Resource Strain: Not on file  Food Insecurity: Not on file  Transportation Needs: Not on file  Physical Activity: Not on file  Stress: Not on file  Social Connections: Not on file    No Known Allergies   Current Outpatient Medications:    ACCU-CHEK GUIDE test strip, AS DIRECTED IN VITRO ONCE A DAY (DX: E11.69) 90 DAYS, Disp: , Rfl:    Accu-Chek Softclix Lancets lancets, daily., Disp: , Rfl:    atorvastatin (LIPITOR) 20 MG tablet, Take 20 mg by mouth daily., Disp: , Rfl:    bictegravir-emtricitabine-tenofovir AF (BIKTARVY) 50-200-25 MG TABS tablet, TAKE 1 TABLET BY MOUTH DAILY., Disp: 30 tablet, Rfl: 11   Blood Glucose Monitoring Suppl (ACCU-CHEK GUIDE ME) w/Device KIT, See admin instructions., Disp: , Rfl:    cyclobenzaprine (FLEXERIL) 10 MG tablet, Take 1 tablet (10 mg total) by mouth 3 (three) times daily as needed for muscle spasms. (Patient not taking: Reported on 01/24/2021), Disp: 30 tablet, Rfl: 0   lisinopril (ZESTRIL) 10 MG tablet, TAKE 1 TABLET (10 MG TOTAL) BY MOUTH DAILY., Disp: 30 tablet, Rfl: 0   metFORMIN (GLUCOPHAGE) 500 MG tablet, Take 500 mg by mouth in the morning  and at bedtime., Disp: , Rfl:    Naproxen Sodium 220 MG CAPS, Take by mouth.  , Disp: , Rfl:    valACYclovir (VALTREX) 1000 MG tablet, TAKE 1 TABLET BY MOUTH DAILY. THEN TAKE 1 TWICE DAILY WITH HSV OUTBREAK, Disp: 44 tablet, Rfl: 5  Current Facility-Administered Medications:    methylPREDNISolone acetate (DEPO-MEDROL) injection 80 mg, 80 mg, Intramuscular, Once, Roselee Culver, MD   Review of Systems  Constitutional:  Negative for activity change, appetite change, chills, diaphoresis, fatigue, fever and unexpected weight change.  HENT:  Negative for congestion, rhinorrhea, sinus pressure, sneezing, sore throat and trouble swallowing.   Eyes:  Negative for photophobia and visual disturbance.  Respiratory:  Negative for cough, chest tightness, shortness of breath,  wheezing and stridor.   Cardiovascular:  Negative for chest pain, palpitations and leg swelling.  Gastrointestinal:  Negative for abdominal distention, abdominal pain, anal bleeding, blood in stool, constipation, diarrhea, nausea and vomiting.  Genitourinary:  Negative for difficulty urinating, dysuria, flank pain and hematuria.  Musculoskeletal:  Negative for arthralgias, back pain, gait problem, joint swelling and myalgias.  Skin:  Negative for color change, pallor, rash and wound.  Neurological:  Negative for dizziness, tremors, weakness and light-headedness.  Hematological:  Negative for adenopathy. Does not bruise/bleed easily.  Psychiatric/Behavioral:  Negative for agitation, behavioral problems, confusion, decreased concentration, dysphoric mood and sleep disturbance.       Objective:   Physical Exam Constitutional:      Appearance: He is well-developed.  HENT:     Head: Normocephalic and atraumatic.  Eyes:     Conjunctiva/sclera: Conjunctivae normal.  Cardiovascular:     Rate and Rhythm: Normal rate and regular rhythm.  Pulmonary:     Effort: Pulmonary effort is normal. No respiratory distress.     Breath sounds: No wheezing.  Abdominal:     General: There is no distension.     Palpations: Abdomen is soft.  Musculoskeletal:        General: No tenderness. Normal range of motion.     Cervical back: Normal range of motion and neck supple.  Skin:    General: Skin is warm and dry.     Coloration: Skin is not pale.     Findings: No erythema or rash.  Neurological:     General: No focal deficit present.     Mental Status: He is alert and oriented to person, place, and time.  Psychiatric:        Mood and Affect: Mood normal.        Behavior: Behavior normal.        Thought Content: Thought content normal.        Judgment: Judgment normal.          Assessment & Plan:  HIV disease:  I have reviewed his most recent VL from 07/28/2021 which was ND and CD4 from same date  which was   Lab Results  Component Value Date   HIV1RNAQUANT Not Detected 07/28/2021   Lab Results  Component Value Date   CD4TABS 1,005 01/10/2021   CD4TABS 1,185 08/03/2020   CD4TABS 935 01/08/2019   I will continue his Biktarvy prescription  Chronic HSV infection: I will continue his valtrex  DM: On metformin.  I think he would benefit potentially from Ozempic to lose weight and treat type 2 diabetes mellitus  HTN: He will continue on his lisinopril. There were no vitals filed for this visit.  There is no height or weight on file to  calculate BMI.  NASH: LFTs from 07/28/2021 reviewed  Hepatic Function Latest Ref Rng & Units 07/28/2021 01/10/2021 08/03/2020  Total Protein 6.1 - 8.1 g/dL 7.3 6.9 7.3  Albumin 3.6 - 5.1 g/dL - - -  AST 10 - 40 U/L 41(H) 90(H) 105(H)  ALT 9 - 46 U/L 69(H) 138(H) 154(H)  Alk Phosphatase 40 - 115 U/L - - -  Total Bilirubin 0.2 - 1.2 mg/dL 1.0 1.2 0.9  Bilirubin, Direct 0.0 - 0.2 mg/dL - - -   Hyperlipidemia: We will check a direct LDL since the triglyceride levels did not allow for calculation of LDL.  We will continue with Lipitor.   Vaccine counseling: Recommended Prevnar 20 which received today.

## 2021-08-10 ENCOUNTER — Encounter: Payer: Self-pay | Admitting: Infectious Disease

## 2021-08-10 ENCOUNTER — Other Ambulatory Visit (HOSPITAL_COMMUNITY): Payer: Self-pay

## 2021-08-10 ENCOUNTER — Ambulatory Visit (INDEPENDENT_AMBULATORY_CARE_PROVIDER_SITE_OTHER): Payer: BC Managed Care – PPO

## 2021-08-10 ENCOUNTER — Encounter (INDEPENDENT_AMBULATORY_CARE_PROVIDER_SITE_OTHER): Payer: Self-pay | Admitting: *Deleted

## 2021-08-10 ENCOUNTER — Ambulatory Visit: Payer: BC Managed Care – PPO | Admitting: Infectious Disease

## 2021-08-10 ENCOUNTER — Other Ambulatory Visit: Payer: Self-pay

## 2021-08-10 VITALS — BP 126/82 | HR 108 | Temp 97.1°F | Wt 269.0 lb

## 2021-08-10 DIAGNOSIS — Z7185 Encounter for immunization safety counseling: Secondary | ICD-10-CM | POA: Insufficient documentation

## 2021-08-10 DIAGNOSIS — Z23 Encounter for immunization: Secondary | ICD-10-CM

## 2021-08-10 DIAGNOSIS — K760 Fatty (change of) liver, not elsewhere classified: Secondary | ICD-10-CM

## 2021-08-10 DIAGNOSIS — Z006 Encounter for examination for normal comparison and control in clinical research program: Secondary | ICD-10-CM

## 2021-08-10 DIAGNOSIS — B20 Human immunodeficiency virus [HIV] disease: Secondary | ICD-10-CM | POA: Diagnosis not present

## 2021-08-10 DIAGNOSIS — E1165 Type 2 diabetes mellitus with hyperglycemia: Secondary | ICD-10-CM | POA: Diagnosis not present

## 2021-08-10 DIAGNOSIS — R7401 Elevation of levels of liver transaminase levels: Secondary | ICD-10-CM

## 2021-08-10 DIAGNOSIS — E785 Hyperlipidemia, unspecified: Secondary | ICD-10-CM

## 2021-08-10 DIAGNOSIS — R635 Abnormal weight gain: Secondary | ICD-10-CM

## 2021-08-10 DIAGNOSIS — B009 Herpesviral infection, unspecified: Secondary | ICD-10-CM | POA: Diagnosis not present

## 2021-08-10 DIAGNOSIS — I10 Essential (primary) hypertension: Secondary | ICD-10-CM

## 2021-08-10 HISTORY — DX: Encounter for immunization safety counseling: Z71.85

## 2021-08-10 HISTORY — DX: Abnormal weight gain: R63.5

## 2021-08-10 HISTORY — DX: Hyperlipidemia, unspecified: E78.5

## 2021-08-10 MED ORDER — BICTEGRAVIR-EMTRICITAB-TENOFOV 50-200-25 MG PO TABS
1.0000 | ORAL_TABLET | Freq: Every day | ORAL | 11 refills | Status: DC
  Filled 2021-08-10 – 2021-08-24 (×2): qty 30, 30d supply, fill #0
  Filled 2021-09-20: qty 30, 30d supply, fill #1
  Filled 2021-10-19: qty 30, 30d supply, fill #2
  Filled 2021-11-15: qty 30, 30d supply, fill #3
  Filled 2021-12-12: qty 30, 30d supply, fill #4
  Filled 2022-01-16: qty 30, 30d supply, fill #5
  Filled 2022-02-13: qty 30, 30d supply, fill #6
  Filled 2022-03-10: qty 30, 30d supply, fill #7
  Filled 2022-04-04: qty 30, 30d supply, fill #8
  Filled 2022-05-01: qty 30, 30d supply, fill #9
  Filled 2022-06-01: qty 30, 30d supply, fill #10
  Filled 2022-06-30: qty 30, 30d supply, fill #11

## 2021-08-10 MED ORDER — VALACYCLOVIR HCL 1 G PO TABS: ORAL_TABLET | ORAL | 5 refills | Status: AC

## 2021-08-10 NOTE — Research (Signed)
Timothy Perry here today for his month 23 visit for Reprieve study. No new complaints or medications. He is currently being followed on study/off study drug. He will return in July for his next study visit.

## 2021-08-11 LAB — LDL CHOLESTEROL, DIRECT: Direct LDL: 38 mg/dL (ref <100)

## 2021-08-22 ENCOUNTER — Other Ambulatory Visit (HOSPITAL_COMMUNITY): Payer: Self-pay

## 2021-08-24 ENCOUNTER — Other Ambulatory Visit (HOSPITAL_COMMUNITY): Payer: Self-pay

## 2021-08-25 ENCOUNTER — Other Ambulatory Visit (HOSPITAL_COMMUNITY): Payer: Self-pay

## 2021-09-20 ENCOUNTER — Other Ambulatory Visit (HOSPITAL_COMMUNITY): Payer: Self-pay

## 2021-09-21 DIAGNOSIS — G4733 Obstructive sleep apnea (adult) (pediatric): Secondary | ICD-10-CM | POA: Diagnosis not present

## 2021-09-26 ENCOUNTER — Other Ambulatory Visit (HOSPITAL_COMMUNITY): Payer: Self-pay

## 2021-10-03 DIAGNOSIS — Z0000 Encounter for general adult medical examination without abnormal findings: Secondary | ICD-10-CM | POA: Diagnosis not present

## 2021-10-03 DIAGNOSIS — E1169 Type 2 diabetes mellitus with other specified complication: Secondary | ICD-10-CM | POA: Diagnosis not present

## 2021-10-03 DIAGNOSIS — I10 Essential (primary) hypertension: Secondary | ICD-10-CM | POA: Diagnosis not present

## 2021-10-03 DIAGNOSIS — K760 Fatty (change of) liver, not elsewhere classified: Secondary | ICD-10-CM | POA: Diagnosis not present

## 2021-10-03 DIAGNOSIS — G4733 Obstructive sleep apnea (adult) (pediatric): Secondary | ICD-10-CM | POA: Diagnosis not present

## 2021-10-19 ENCOUNTER — Other Ambulatory Visit (HOSPITAL_COMMUNITY): Payer: Self-pay

## 2021-10-25 ENCOUNTER — Other Ambulatory Visit (HOSPITAL_COMMUNITY): Payer: Self-pay

## 2021-11-04 DIAGNOSIS — G4733 Obstructive sleep apnea (adult) (pediatric): Secondary | ICD-10-CM | POA: Diagnosis not present

## 2021-11-15 ENCOUNTER — Other Ambulatory Visit (HOSPITAL_COMMUNITY): Payer: Self-pay

## 2021-11-22 ENCOUNTER — Other Ambulatory Visit (HOSPITAL_COMMUNITY): Payer: Self-pay

## 2021-12-08 ENCOUNTER — Other Ambulatory Visit (HOSPITAL_COMMUNITY): Payer: Self-pay

## 2021-12-09 ENCOUNTER — Other Ambulatory Visit (HOSPITAL_COMMUNITY): Payer: Self-pay

## 2021-12-12 ENCOUNTER — Other Ambulatory Visit (HOSPITAL_COMMUNITY): Payer: Self-pay

## 2021-12-22 ENCOUNTER — Other Ambulatory Visit (HOSPITAL_COMMUNITY): Payer: Self-pay

## 2022-01-04 ENCOUNTER — Other Ambulatory Visit: Payer: Self-pay

## 2022-01-04 ENCOUNTER — Encounter (INDEPENDENT_AMBULATORY_CARE_PROVIDER_SITE_OTHER): Payer: Self-pay

## 2022-01-04 VITALS — BP 114/79 | HR 85 | Temp 98.0°F | Resp 16 | Wt 266.2 lb

## 2022-01-04 DIAGNOSIS — Z006 Encounter for examination for normal comparison and control in clinical research program: Secondary | ICD-10-CM

## 2022-01-04 NOTE — Research (Signed)
Participant seen for final visit for Reprieve Study due to study closure.  All procedures carried out per study protocol. Participant denies any current concerns or issues.

## 2022-01-12 ENCOUNTER — Other Ambulatory Visit (HOSPITAL_COMMUNITY): Payer: Self-pay

## 2022-01-16 ENCOUNTER — Other Ambulatory Visit (HOSPITAL_COMMUNITY): Payer: Self-pay

## 2022-01-18 ENCOUNTER — Other Ambulatory Visit (HOSPITAL_COMMUNITY): Payer: Self-pay

## 2022-01-24 DIAGNOSIS — Z20822 Contact with and (suspected) exposure to covid-19: Secondary | ICD-10-CM | POA: Diagnosis not present

## 2022-01-24 DIAGNOSIS — Z6836 Body mass index (BMI) 36.0-36.9, adult: Secondary | ICD-10-CM | POA: Diagnosis not present

## 2022-01-24 DIAGNOSIS — J029 Acute pharyngitis, unspecified: Secondary | ICD-10-CM | POA: Diagnosis not present

## 2022-01-30 DIAGNOSIS — M542 Cervicalgia: Secondary | ICD-10-CM | POA: Diagnosis not present

## 2022-01-30 DIAGNOSIS — M546 Pain in thoracic spine: Secondary | ICD-10-CM | POA: Diagnosis not present

## 2022-01-31 ENCOUNTER — Other Ambulatory Visit: Payer: BC Managed Care – PPO

## 2022-02-09 ENCOUNTER — Other Ambulatory Visit (HOSPITAL_COMMUNITY): Payer: Self-pay

## 2022-02-09 DIAGNOSIS — M25512 Pain in left shoulder: Secondary | ICD-10-CM | POA: Diagnosis not present

## 2022-02-09 DIAGNOSIS — M542 Cervicalgia: Secondary | ICD-10-CM | POA: Diagnosis not present

## 2022-02-13 ENCOUNTER — Other Ambulatory Visit (HOSPITAL_COMMUNITY): Payer: Self-pay

## 2022-02-14 DIAGNOSIS — G4733 Obstructive sleep apnea (adult) (pediatric): Secondary | ICD-10-CM | POA: Diagnosis not present

## 2022-02-16 ENCOUNTER — Other Ambulatory Visit (HOSPITAL_COMMUNITY): Payer: Self-pay

## 2022-02-23 ENCOUNTER — Other Ambulatory Visit: Payer: Self-pay | Admitting: Sports Medicine

## 2022-02-23 DIAGNOSIS — M542 Cervicalgia: Secondary | ICD-10-CM

## 2022-02-23 DIAGNOSIS — M79602 Pain in left arm: Secondary | ICD-10-CM

## 2022-02-24 ENCOUNTER — Encounter: Payer: BC Managed Care – PPO | Admitting: Infectious Disease

## 2022-03-03 ENCOUNTER — Ambulatory Visit
Admission: RE | Admit: 2022-03-03 | Discharge: 2022-03-03 | Disposition: A | Discharge status: Home / Self Care | Payer: BC Managed Care – PPO | Source: Ambulatory Visit | Attending: Sports Medicine | Admitting: Sports Medicine

## 2022-03-03 DIAGNOSIS — M542 Cervicalgia: Secondary | ICD-10-CM

## 2022-03-03 DIAGNOSIS — M79602 Pain in left arm: Secondary | ICD-10-CM

## 2022-03-04 ENCOUNTER — Other Ambulatory Visit: Payer: BC Managed Care – PPO

## 2022-03-06 IMAGING — US US ABDOMEN LIMITED
1 series · 14 of 25 positions shown · non-contrast
Comparison: 04/23/2013

CLINICAL DATA: Hepatic steatosis

EXAM:
ULTRASOUND ABDOMEN LIMITED RIGHT UPPER QUADRANT

[Series 1: us abdomen limited · 0.25mm/px · 14 of 61 slices shown]
[im 1/61]
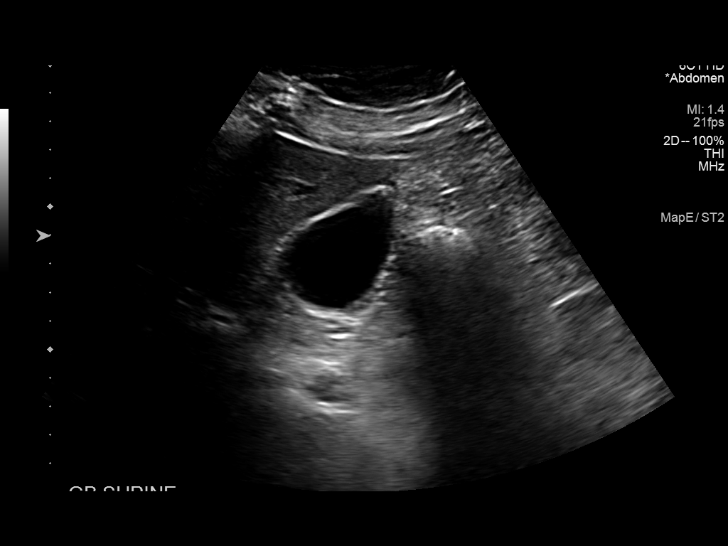
[im 6/61]
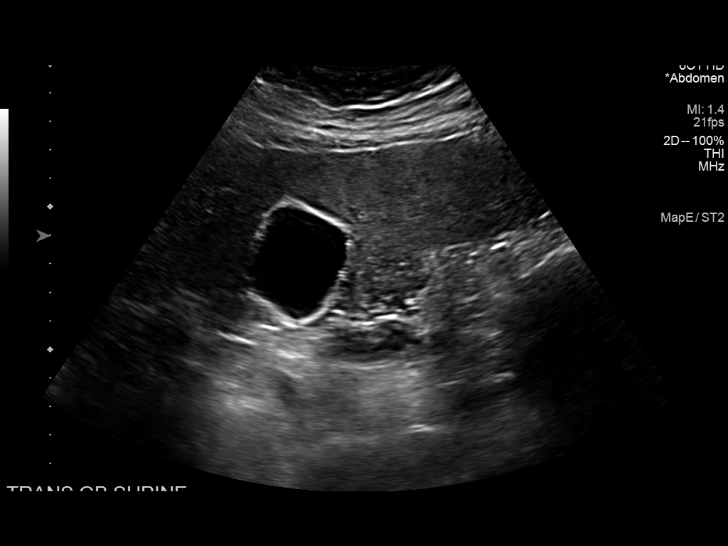
[im 11/61]
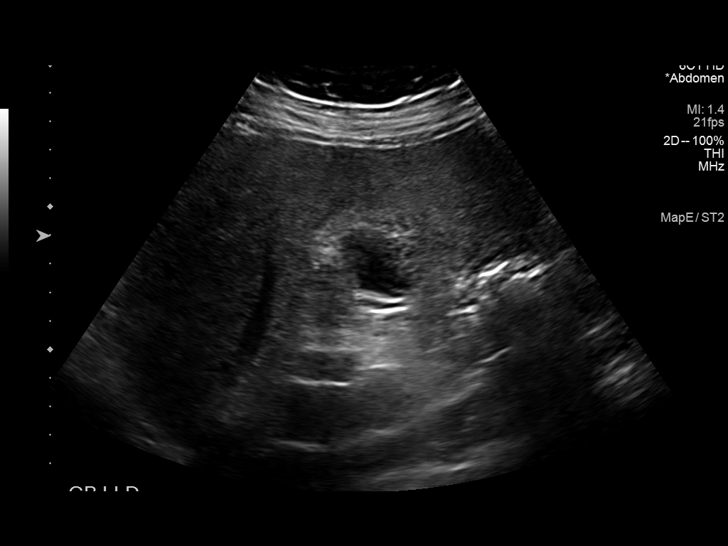
[im 16/61]
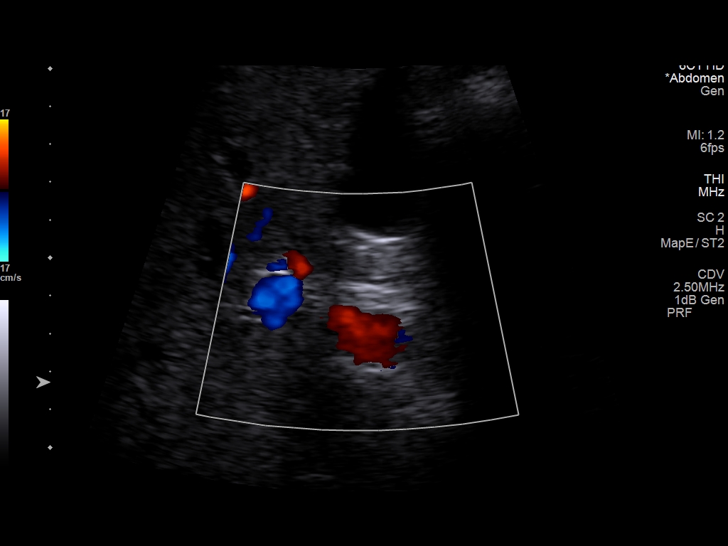
[im 21/61]
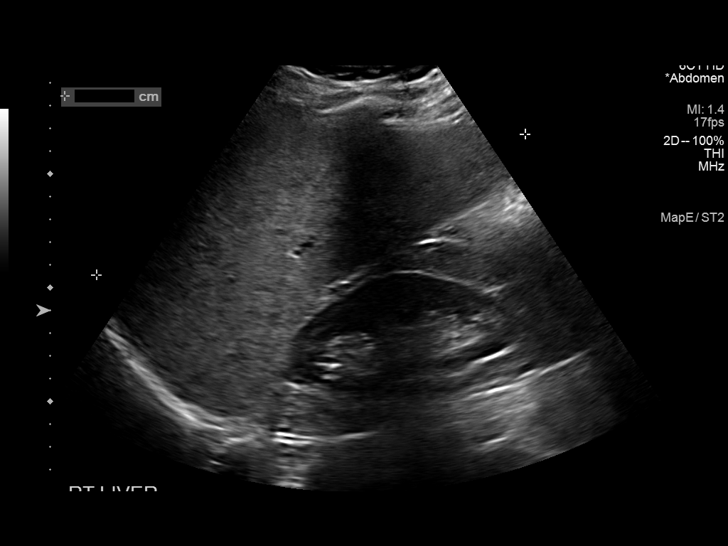
[im 23/61]
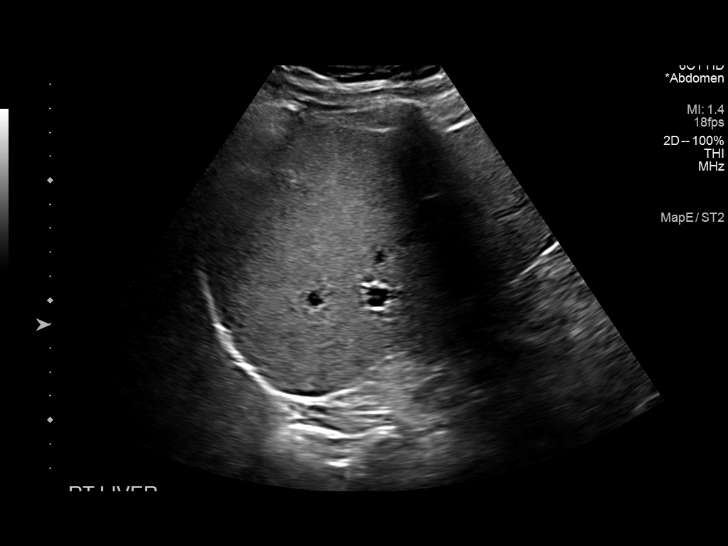
[im 28/61]
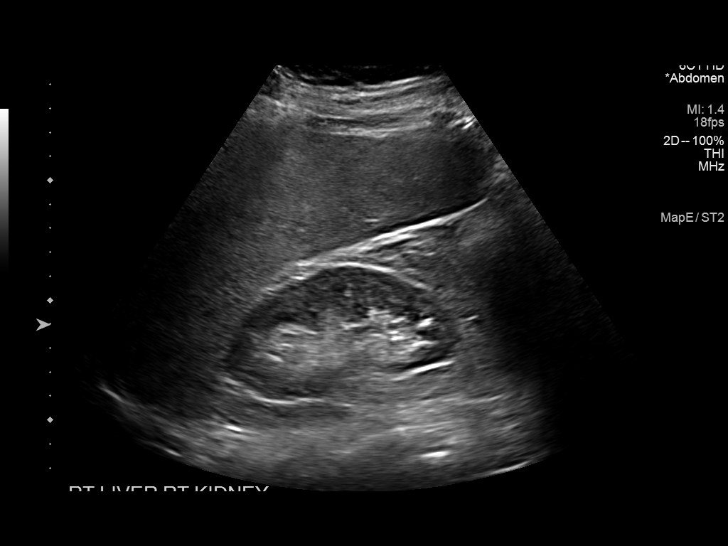
[im 33/61]
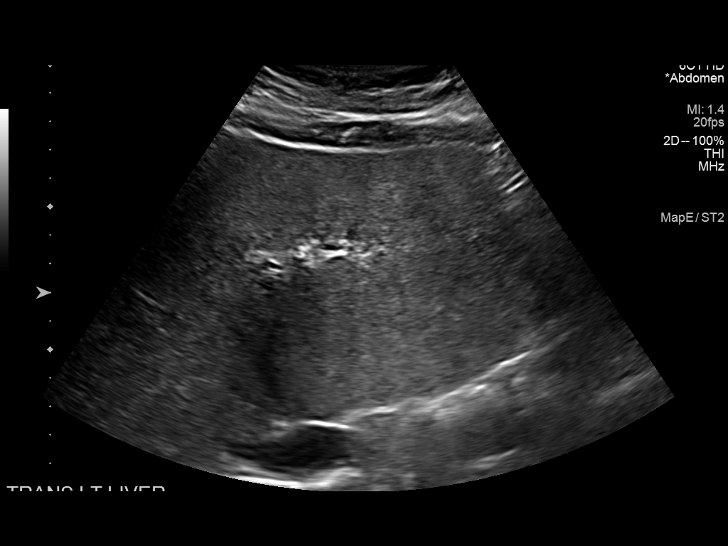
[im 38/61]
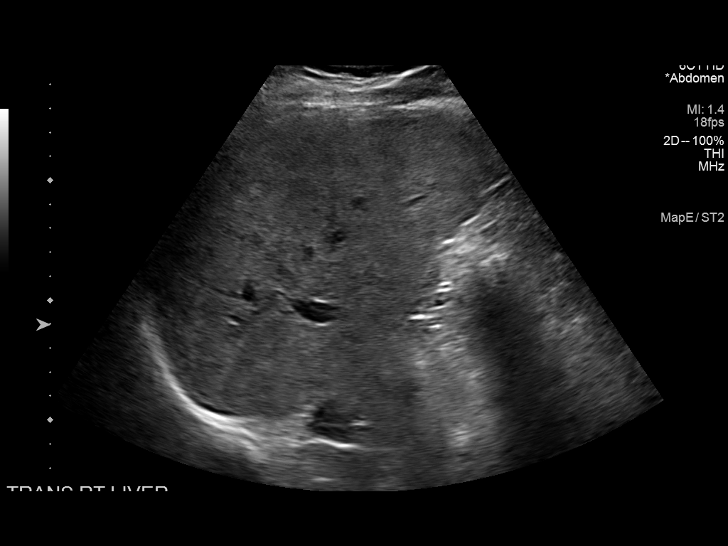
[im 41/61]
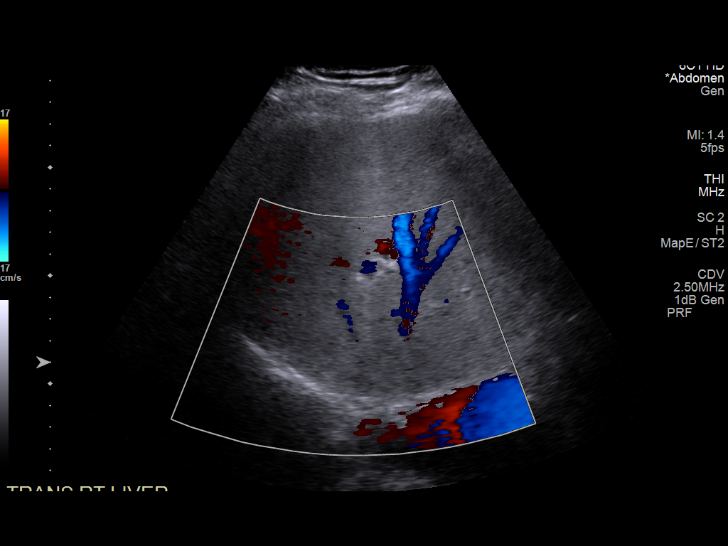
[im 46/61]
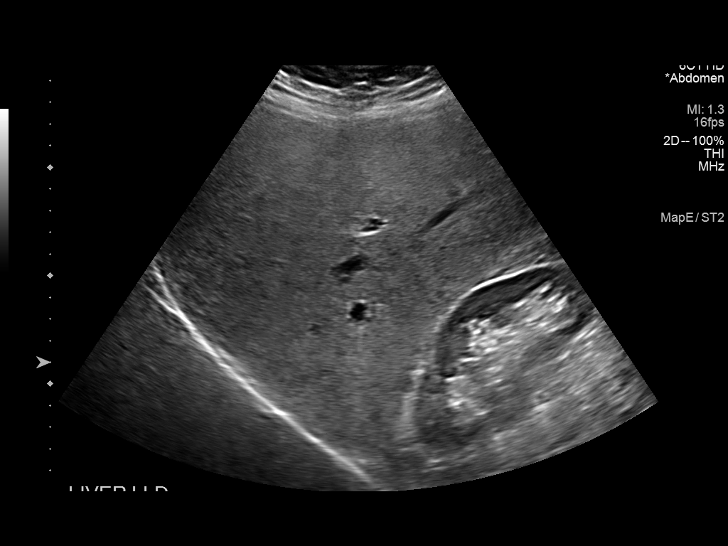
[im 51/61]
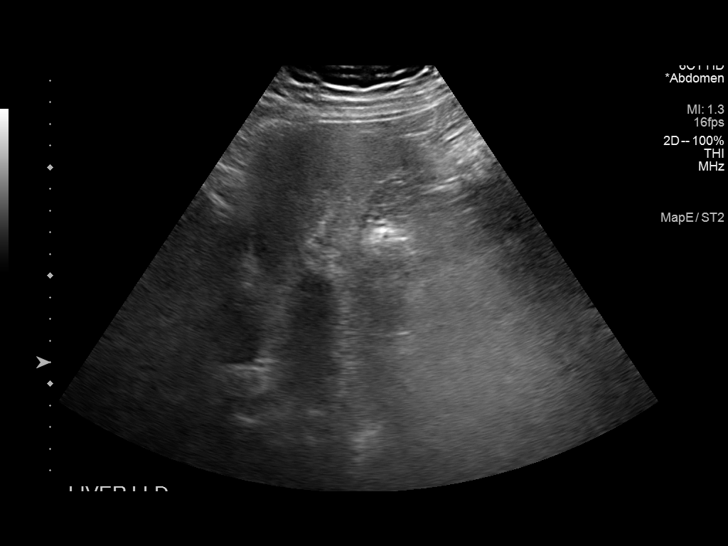
[im 56/61]
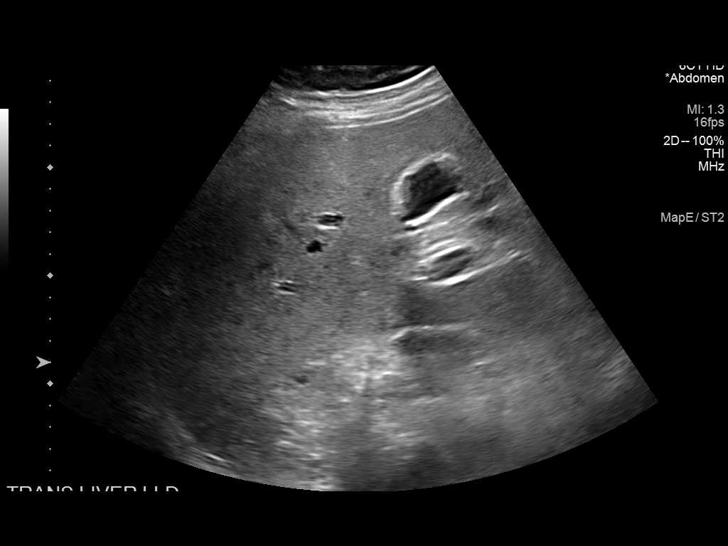
[im 61/61]
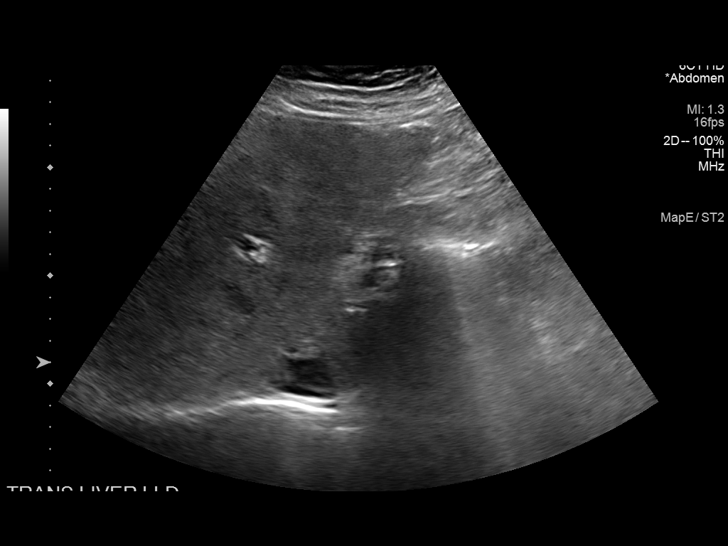

[14 of 25 positions shown; findings below may reference images not displayed]

FINDINGS: Gallbladder:

No gallstones or wall thickening visualized. No sonographic Murphy
sign noted by sonographer. Previously noted 4 mm cholesterol polyp
along the gallbladder wall is not visualized on the present exam.

Common bile duct:

Diameter: 0.4 cm

Liver:

No focal lesion identified. Increased parenchymal echogenicity.
Portal vein is patent on color Doppler imaging with normal direction
of blood flow towards the liver.

Other: None.
IMPRESSION: Increased hepatic echogenicity, compatible with hepatic steatosis.

## 2022-03-08 DIAGNOSIS — M5412 Radiculopathy, cervical region: Secondary | ICD-10-CM | POA: Diagnosis not present

## 2022-03-10 ENCOUNTER — Other Ambulatory Visit (HOSPITAL_COMMUNITY): Payer: Self-pay

## 2022-03-14 ENCOUNTER — Other Ambulatory Visit (HOSPITAL_COMMUNITY): Payer: Self-pay

## 2022-03-15 DIAGNOSIS — M5412 Radiculopathy, cervical region: Secondary | ICD-10-CM | POA: Diagnosis not present

## 2022-03-17 DIAGNOSIS — G4733 Obstructive sleep apnea (adult) (pediatric): Secondary | ICD-10-CM | POA: Diagnosis not present

## 2022-04-04 ENCOUNTER — Other Ambulatory Visit (HOSPITAL_COMMUNITY): Payer: Self-pay

## 2022-04-07 DIAGNOSIS — E7800 Pure hypercholesterolemia, unspecified: Secondary | ICD-10-CM | POA: Diagnosis not present

## 2022-04-07 DIAGNOSIS — Z23 Encounter for immunization: Secondary | ICD-10-CM | POA: Diagnosis not present

## 2022-04-07 DIAGNOSIS — Z7984 Long term (current) use of oral hypoglycemic drugs: Secondary | ICD-10-CM | POA: Diagnosis not present

## 2022-04-07 DIAGNOSIS — G4733 Obstructive sleep apnea (adult) (pediatric): Secondary | ICD-10-CM | POA: Diagnosis not present

## 2022-04-07 DIAGNOSIS — Z5181 Encounter for therapeutic drug level monitoring: Secondary | ICD-10-CM | POA: Diagnosis not present

## 2022-04-07 DIAGNOSIS — Z21 Asymptomatic human immunodeficiency virus [HIV] infection status: Secondary | ICD-10-CM | POA: Diagnosis not present

## 2022-04-07 DIAGNOSIS — K760 Fatty (change of) liver, not elsewhere classified: Secondary | ICD-10-CM | POA: Diagnosis not present

## 2022-04-07 DIAGNOSIS — E1169 Type 2 diabetes mellitus with other specified complication: Secondary | ICD-10-CM | POA: Diagnosis not present

## 2022-04-10 ENCOUNTER — Other Ambulatory Visit (HOSPITAL_COMMUNITY): Payer: Self-pay

## 2022-04-11 DIAGNOSIS — M5412 Radiculopathy, cervical region: Secondary | ICD-10-CM | POA: Diagnosis not present

## 2022-04-16 DIAGNOSIS — G4733 Obstructive sleep apnea (adult) (pediatric): Secondary | ICD-10-CM | POA: Diagnosis not present

## 2022-05-01 ENCOUNTER — Other Ambulatory Visit (HOSPITAL_COMMUNITY): Payer: Self-pay

## 2022-05-09 ENCOUNTER — Other Ambulatory Visit (HOSPITAL_COMMUNITY): Payer: Self-pay

## 2022-05-31 ENCOUNTER — Other Ambulatory Visit (HOSPITAL_COMMUNITY): Payer: Self-pay

## 2022-06-01 ENCOUNTER — Other Ambulatory Visit (HOSPITAL_COMMUNITY): Payer: Self-pay

## 2022-06-01 DIAGNOSIS — E1169 Type 2 diabetes mellitus with other specified complication: Secondary | ICD-10-CM | POA: Diagnosis not present

## 2022-06-01 DIAGNOSIS — U071 COVID-19: Secondary | ICD-10-CM | POA: Diagnosis not present

## 2022-06-01 DIAGNOSIS — Z21 Asymptomatic human immunodeficiency virus [HIV] infection status: Secondary | ICD-10-CM | POA: Diagnosis not present

## 2022-06-06 ENCOUNTER — Other Ambulatory Visit (HOSPITAL_COMMUNITY): Payer: Self-pay

## 2022-06-06 ENCOUNTER — Other Ambulatory Visit: Payer: Self-pay

## 2022-06-28 ENCOUNTER — Other Ambulatory Visit (HOSPITAL_COMMUNITY): Payer: Self-pay

## 2022-06-30 ENCOUNTER — Other Ambulatory Visit (HOSPITAL_COMMUNITY): Payer: Self-pay

## 2022-07-03 ENCOUNTER — Other Ambulatory Visit: Payer: Self-pay

## 2022-07-03 ENCOUNTER — Other Ambulatory Visit (HOSPITAL_COMMUNITY): Payer: Self-pay

## 2022-07-04 ENCOUNTER — Other Ambulatory Visit (HOSPITAL_COMMUNITY): Payer: Self-pay

## 2022-07-12 ENCOUNTER — Other Ambulatory Visit: Payer: Self-pay

## 2022-07-12 DIAGNOSIS — Z113 Encounter for screening for infections with a predominantly sexual mode of transmission: Secondary | ICD-10-CM

## 2022-07-12 DIAGNOSIS — Z79899 Other long term (current) drug therapy: Secondary | ICD-10-CM

## 2022-07-12 DIAGNOSIS — B20 Human immunodeficiency virus [HIV] disease: Secondary | ICD-10-CM

## 2022-07-19 ENCOUNTER — Other Ambulatory Visit (HOSPITAL_COMMUNITY)
Admission: RE | Admit: 2022-07-19 | Discharge: 2022-07-19 | Disposition: A | Discharge status: Home / Self Care | Payer: BC Managed Care – PPO | Source: Ambulatory Visit | Attending: Infectious Disease | Admitting: Infectious Disease

## 2022-07-19 ENCOUNTER — Other Ambulatory Visit: Payer: BC Managed Care – PPO

## 2022-07-19 ENCOUNTER — Other Ambulatory Visit: Payer: Self-pay

## 2022-07-19 DIAGNOSIS — Z113 Encounter for screening for infections with a predominantly sexual mode of transmission: Secondary | ICD-10-CM | POA: Diagnosis not present

## 2022-07-19 DIAGNOSIS — Z79899 Other long term (current) drug therapy: Secondary | ICD-10-CM | POA: Diagnosis not present

## 2022-07-19 DIAGNOSIS — B20 Human immunodeficiency virus [HIV] disease: Secondary | ICD-10-CM | POA: Diagnosis not present

## 2022-07-20 LAB — T-HELPER CELL (CD4) - (RCID CLINIC ONLY)
CD4 % Helper T Cell: 55 % (ref 33–65)
CD4 T Cell Abs: 1059 /uL (ref 400–1790)

## 2022-07-20 LAB — URINE CYTOLOGY ANCILLARY ONLY
Chlamydia: NEGATIVE
Comment: NEGATIVE
Comment: NORMAL
Neisseria Gonorrhea: NEGATIVE

## 2022-07-22 LAB — LIPID PANEL
Cholesterol: 108 mg/dL (ref <200)
HDL: 33 mg/dL — ABNORMAL LOW (ref >=40)
LDL Cholesterol (Calc): 52 mg/dL (calc)
Non-HDL Cholesterol (Calc): 75 mg/dL (calc) (ref <130)
Total CHOL/HDL Ratio: 3.3 (calc) (ref <5.0)
Triglycerides: 155 mg/dL — ABNORMAL HIGH (ref <150)

## 2022-07-22 LAB — CBC WITH DIFFERENTIAL/PLATELET
Absolute Monocytes: 305 cells/uL (ref 200–950)
Basophils Absolute: 57 cells/uL (ref 0–200)
Basophils Relative: 0.8 %
Eosinophils Absolute: 128 cells/uL (ref 15–500)
Eosinophils Relative: 1.8 %
HCT: 39.5 % (ref 38.5–50.0)
Hemoglobin: 13.5 g/dL (ref 13.2–17.1)
Lymphs Abs: 2009 cells/uL (ref 850–3900)
MCH: 32.8 pg (ref 27.0–33.0)
MCHC: 34.2 g/dL (ref 32.0–36.0)
MCV: 96.1 fL (ref 80.0–100.0)
MPV: 10.4 fL (ref 7.5–12.5)
Monocytes Relative: 4.3 %
Neutro Abs: 4601 cells/uL (ref 1500–7800)
Neutrophils Relative %: 64.8 %
Platelets: 207 10*3/uL (ref 140–400)
RBC: 4.11 10*6/uL — ABNORMAL LOW (ref 4.20–5.80)
RDW: 12.9 % (ref 11.0–15.0)
Total Lymphocyte: 28.3 %
WBC: 7.1 10*3/uL (ref 3.8–10.8)

## 2022-07-22 LAB — COMPLETE METABOLIC PANEL WITH GFR
AG Ratio: 1.6 (calc) (ref 1.0–2.5)
ALT: 56 U/L — ABNORMAL HIGH (ref 9–46)
AST: 30 U/L (ref 10–40)
Albumin: 4.4 g/dL (ref 3.6–5.1)
Alkaline phosphatase (APISO): 85 U/L (ref 36–130)
BUN: 10 mg/dL (ref 7–25)
CO2: 25 mmol/L (ref 20–32)
Calcium: 9.3 mg/dL (ref 8.6–10.3)
Chloride: 106 mmol/L (ref 98–110)
Creat: 0.95 mg/dL (ref 0.60–1.29)
Globulin: 2.8 g/dL (calc) (ref 1.9–3.7)
Glucose, Bld: 148 mg/dL — ABNORMAL HIGH (ref 65–99)
Potassium: 3.9 mmol/L (ref 3.5–5.3)
Sodium: 140 mmol/L (ref 135–146)
Total Bilirubin: 1.6 mg/dL — ABNORMAL HIGH (ref 0.2–1.2)
Total Protein: 7.2 g/dL (ref 6.1–8.1)
eGFR: 99 mL/min/{1.73_m2} (ref >=60)

## 2022-07-22 LAB — HIV-1 RNA QUANT-NO REFLEX-BLD
HIV 1 RNA Quant: NOT DETECTED Copies/mL
HIV-1 RNA Quant, Log: NOT DETECTED Log cps/mL

## 2022-07-22 LAB — RPR: RPR Ser Ql: NONREACTIVE

## 2022-07-31 ENCOUNTER — Other Ambulatory Visit (HOSPITAL_COMMUNITY): Payer: Self-pay

## 2022-08-02 ENCOUNTER — Ambulatory Visit: Payer: BC Managed Care – PPO | Admitting: Infectious Disease

## 2022-08-14 DIAGNOSIS — F419 Anxiety disorder, unspecified: Secondary | ICD-10-CM | POA: Diagnosis not present

## 2022-08-22 ENCOUNTER — Telehealth (INDEPENDENT_AMBULATORY_CARE_PROVIDER_SITE_OTHER): Payer: BC Managed Care – PPO | Admitting: Infectious Disease

## 2022-08-22 ENCOUNTER — Other Ambulatory Visit: Payer: Self-pay

## 2022-08-22 ENCOUNTER — Telehealth: Payer: Self-pay

## 2022-08-22 ENCOUNTER — Other Ambulatory Visit (HOSPITAL_COMMUNITY): Payer: Self-pay

## 2022-08-22 DIAGNOSIS — E785 Hyperlipidemia, unspecified: Secondary | ICD-10-CM

## 2022-08-22 DIAGNOSIS — F419 Anxiety disorder, unspecified: Secondary | ICD-10-CM

## 2022-08-22 DIAGNOSIS — K760 Fatty (change of) liver, not elsewhere classified: Secondary | ICD-10-CM

## 2022-08-22 DIAGNOSIS — I10 Essential (primary) hypertension: Secondary | ICD-10-CM

## 2022-08-22 DIAGNOSIS — F32A Depression, unspecified: Secondary | ICD-10-CM

## 2022-08-22 DIAGNOSIS — F17210 Nicotine dependence, cigarettes, uncomplicated: Secondary | ICD-10-CM

## 2022-08-22 DIAGNOSIS — B20 Human immunodeficiency virus [HIV] disease: Secondary | ICD-10-CM | POA: Diagnosis not present

## 2022-08-22 DIAGNOSIS — B009 Herpesviral infection, unspecified: Secondary | ICD-10-CM | POA: Diagnosis not present

## 2022-08-22 NOTE — Progress Notes (Signed)
Virtual Visit via Video Note  I connected with Timothy Perry on 08/22/22 at 10:15 AM EST by a video enabled telemedicine application and verified that I am speaking with the correct person using two identifiers.  Location: Patient: Work, private office Provider: [facility]   I discussed the limitations of evaluation and management by telemedicine and the availability of in person appointments. The patient expressed understanding and agreed to proceed.  History of Present Illness:  Is a 47 year old Caucasian man with HIV that is been perfectly controlled and BIKTARVY with prior suppression on Atripla.  He is on metformin as well as Ozempic to treat his diabetes mellitus he is also on a statin.  He is on Valtrex for HSV.  He is tiring of his job which is contributing some to significant depressive symptoms.  He is now in counseling for that and also open to medications if need be.  He was specifically interested in [place] today he would like the way that a long-acting therapy could relieve him of having to take one of the pills he has to take every day.  I suggested that we could potentially enroll him in the embrace study as I do not see no obvious contraindication for him being in that study.  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     Observations/Objective: Appeared comfortable in no acute distress on the video feed  Assessment and Plan:   HIV disease:  I have reviewed Poseidon Schrauth [facility]'s labs including viral load which was  Lab Results  Component Value Date   HIV1RNAQUANT Not Detected 07/19/2022   and cd4 which was  Lab Results  Component Value Date   CD4TABS 1,059 07/19/2022     I am continuing patient's prescription for Biktarvy  I will forward note to [facility] for consideration for screening for EMBRACE  If not he would like  to go on [place] and he is accepting of 1% VF risk despite on time injections  Depression largely related to his job he is working with counselor currently would be open to medications  NASH: Causing transient elevations in his transaminases and bilirubin from time to time.  Hopefully this will improve with weight loss from the Ozempic  DM: Is losing weight on Ozempic and is also on metformin.  Hypertension continue on lisinopril  Hyperlipidemia is continuing on his lovastatin  Herpes simplex continue on Valtrex   Follow Up Instructions:    I discussed the assessment and treatment plan with the patient. The patient was provided an opportunity to ask questions and all were answered. The patient agreed with the plan and demonstrated an understanding of the instructions.   The patient was advised to call back or seek an in-person evaluation if the symptoms worsen or if the condition fails to improve as anticipated.  I spent 42 minutes with the patient including than 50% of the time in face to face counseling of the patient adding long-acting therapy including that provided potentially through embrace study versus conventional [place], along with review of medical records in preparation for the visit and during the visit and in coordination of his care.     Alcide Evener, MD

## 2022-08-22 NOTE — Telephone Encounter (Signed)
[facility] Patient Advocate Encounter   Received notification from [employer] that prior authorization for Timothy Perry is required.   PA submitted on 08/22/22 Key [id number] Status is pending    [facility] will continue to follow.   Ileene Patrick, [facility] Patient [facility] Phone: [phone] Fax:  [phone]

## 2022-08-24 ENCOUNTER — Other Ambulatory Visit (HOSPITAL_COMMUNITY): Payer: Self-pay

## 2022-08-27 ENCOUNTER — Other Ambulatory Visit: Payer: Self-pay | Admitting: Infectious Disease

## 2022-08-29 ENCOUNTER — Other Ambulatory Visit (HOSPITAL_COMMUNITY): Payer: Self-pay

## 2022-09-05 DIAGNOSIS — F419 Anxiety disorder, unspecified: Secondary | ICD-10-CM | POA: Diagnosis not present

## 2022-09-11 DIAGNOSIS — F419 Anxiety disorder, unspecified: Secondary | ICD-10-CM | POA: Diagnosis not present

## 2022-09-25 DIAGNOSIS — F419 Anxiety disorder, unspecified: Secondary | ICD-10-CM | POA: Diagnosis not present

## 2022-12-05 ENCOUNTER — Other Ambulatory Visit: Payer: Self-pay

## 2022-12-05 DIAGNOSIS — B20 Human immunodeficiency virus [HIV] disease: Secondary | ICD-10-CM

## 2022-12-05 MED ORDER — BICTEGRAVIR-EMTRICITAB-TENOFOV 50-200-25 MG PO TABS: 1.0000 | ORAL_TABLET | Freq: Every day | ORAL | 6 refills | Status: DC

## 2023-04-27 ENCOUNTER — Other Ambulatory Visit: Payer: Self-pay | Admitting: Infectious Disease

## 2023-05-22 ENCOUNTER — Other Ambulatory Visit: Payer: Medicaid Other

## 2023-06-11 NOTE — Progress Notes (Unsigned)
Virtual Visit via Video Note  I connected with Timothy Perry on 06/11/23 at  9:45 AM EST by a video enabled telemedicine application and verified that I am speaking with the correct person using two identifiers.  Location: Patient: *** Provider: ***   I discussed the limitations of evaluation and management by telemedicine and the availability of in person appointments. The patient expressed understanding and agreed to proceed.  History of Present Illness:    Observations/Objective:   Assessment and Plan:   Follow Up Instructions:    I discussed the assessment and treatment plan with the patient. The patient was provided an opportunity to ask questions and all were answered. The patient agreed with the plan and demonstrated an understanding of the instructions.   The patient was advised to call back or seek an in-person evaluation if the symptoms worsen or if the condition fails to improve as anticipated.  I provided *** minutes of non-face-to-face time during this encounter.   Acey Lav, MD

## 2023-06-12 ENCOUNTER — Other Ambulatory Visit: Payer: Self-pay

## 2023-06-12 ENCOUNTER — Telehealth: Payer: Medicaid Other | Admitting: Infectious Disease

## 2023-06-12 DIAGNOSIS — R635 Abnormal weight gain: Secondary | ICD-10-CM

## 2023-06-12 DIAGNOSIS — E785 Hyperlipidemia, unspecified: Secondary | ICD-10-CM

## 2023-06-12 DIAGNOSIS — K760 Fatty (change of) liver, not elsewhere classified: Secondary | ICD-10-CM

## 2023-06-12 DIAGNOSIS — B20 Human immunodeficiency virus [HIV] disease: Secondary | ICD-10-CM

## 2023-06-12 DIAGNOSIS — Z7185 Encounter for immunization safety counseling: Secondary | ICD-10-CM

## 2023-06-12 DIAGNOSIS — E1165 Type 2 diabetes mellitus with hyperglycemia: Secondary | ICD-10-CM

## 2023-07-03 ENCOUNTER — Other Ambulatory Visit: Payer: Self-pay

## 2023-07-03 ENCOUNTER — Other Ambulatory Visit: Payer: 59

## 2023-07-03 DIAGNOSIS — B20 Human immunodeficiency virus [HIV] disease: Secondary | ICD-10-CM

## 2023-07-03 DIAGNOSIS — Z113 Encounter for screening for infections with a predominantly sexual mode of transmission: Secondary | ICD-10-CM

## 2023-07-03 MED ORDER — BICTEGRAVIR-EMTRICITAB-TENOFOV 50-200-25 MG PO TABS: 1.0000 | ORAL_TABLET | Freq: Every day | ORAL | 0 refills | Status: DC

## 2023-07-04 ENCOUNTER — Other Ambulatory Visit: Payer: Self-pay

## 2023-07-04 DIAGNOSIS — B20 Human immunodeficiency virus [HIV] disease: Secondary | ICD-10-CM

## 2023-07-04 LAB — C. TRACHOMATIS/N. GONORRHOEAE RNA
C. trachomatis RNA, TMA: NOT DETECTED
N. gonorrhoeae RNA, TMA: NOT DETECTED

## 2023-07-04 MED ORDER — BICTEGRAVIR-EMTRICITAB-TENOFOV 50-200-25 MG PO TABS: 1.0000 | ORAL_TABLET | Freq: Every day | ORAL | 0 refills | Status: DC

## 2023-07-05 LAB — LIPID PANEL
Cholesterol: 78 mg/dL (ref <200)
HDL: 33 mg/dL — ABNORMAL LOW (ref >=40)
LDL Cholesterol (Calc): 24 mg/dL
Non-HDL Cholesterol (Calc): 45 mg/dL (ref <130)
Total CHOL/HDL Ratio: 2.4 (calc) (ref <5.0)
Triglycerides: 126 mg/dL (ref <150)

## 2023-07-05 LAB — CBC WITH DIFFERENTIAL/PLATELET
Absolute Lymphocytes: 1850 {cells}/uL (ref 850–3900)
Absolute Monocytes: 338 {cells}/uL (ref 200–950)
Basophils Absolute: 43 {cells}/uL (ref 0–200)
Basophils Relative: 0.6 %
Eosinophils Absolute: 58 {cells}/uL (ref 15–500)
Eosinophils Relative: 0.8 %
HCT: 42.5 % (ref 38.5–50.0)
Hemoglobin: 14.4 g/dL (ref 13.2–17.1)
MCH: 33.3 pg — ABNORMAL HIGH (ref 27.0–33.0)
MCHC: 33.9 g/dL (ref 32.0–36.0)
MCV: 98.4 fL (ref 80.0–100.0)
MPV: 10.3 fL (ref 7.5–12.5)
Monocytes Relative: 4.7 %
Neutro Abs: 4910 {cells}/uL (ref 1500–7800)
Neutrophils Relative %: 68.2 %
Platelets: 212 10*3/uL (ref 140–400)
RBC: 4.32 10*6/uL (ref 4.20–5.80)
RDW: 12.3 % (ref 11.0–15.0)
Total Lymphocyte: 25.7 %
WBC: 7.2 10*3/uL (ref 3.8–10.8)

## 2023-07-05 LAB — COMPLETE METABOLIC PANEL WITH GFR
AG Ratio: 1.4 (calc) (ref 1.0–2.5)
ALT: 46 U/L (ref 9–46)
AST: 30 U/L (ref 10–40)
Albumin: 4.3 g/dL (ref 3.6–5.1)
Alkaline phosphatase (APISO): 103 U/L (ref 36–130)
BUN: 9 mg/dL (ref 7–25)
CO2: 27 mmol/L (ref 20–32)
Calcium: 9.7 mg/dL (ref 8.6–10.3)
Chloride: 105 mmol/L (ref 98–110)
Creat: 1.07 mg/dL (ref 0.60–1.29)
Globulin: 3 g/dL (ref 1.9–3.7)
Glucose, Bld: 115 mg/dL — ABNORMAL HIGH (ref 65–99)
Potassium: 3.8 mmol/L (ref 3.5–5.3)
Sodium: 143 mmol/L (ref 135–146)
Total Bilirubin: 1.5 mg/dL — ABNORMAL HIGH (ref 0.2–1.2)
Total Protein: 7.3 g/dL (ref 6.1–8.1)
eGFR: 85 mL/min/{1.73_m2} (ref >=60)

## 2023-07-05 LAB — HIV-1 RNA QUANT-NO REFLEX-BLD
HIV 1 RNA Quant: NOT DETECTED {copies}/mL
HIV-1 RNA Quant, Log: NOT DETECTED {Log_copies}/mL

## 2023-07-05 LAB — T-HELPER CELLS (CD4) COUNT (NOT AT ARMC)
Absolute CD4: 1174 {cells}/uL (ref 490–1740)
CD4 T Helper %: 54 % (ref 30–61)
Total lymphocyte count: 2191 {cells}/uL (ref 850–3900)

## 2023-07-05 LAB — RPR: RPR Ser Ql: NONREACTIVE

## 2023-07-10 ENCOUNTER — Ambulatory Visit: Payer: Medicaid Other | Admitting: Infectious Disease

## 2023-07-16 NOTE — Progress Notes (Deleted)
Subjective:    Patient ID: Timothy Perry, male    DOB: 1975/09/25, 48 y.o.   MRN: [id number]  HPI   Past Medical History:  Diagnosis Date   Fatty liver 02/11/2020   Herpes infection 02/11/2020   HIV (human immunodeficiency virus infection) (HCC) 03/26/2009   RCID;  participant in START Study   Hyperlipidemia 08/10/2021   Hypertension 01/24/2021   Priapism    occasional, mornings   Substance abuse (HCC)    Type 2 diabetes mellitus with hyperglycemia (HCC) 01/24/2021   Vaccine counseling 08/10/2021   Vesicular rash 01/08/2019   Weight gain 08/10/2021    No past surgical history on file.  Family History  Problem Relation Age of Onset   Thyroid disease Mother    Asthma Brother       Social History   Socioeconomic History   Marital status: Single    Spouse name: [name]   Number of children: 0   Years of education: 15   Highest education level: Not on file  Occupational History   Occupation: Substance Abuse Counselor   Tobacco Use   Smoking status: Every Day    Current packs/day: 0.20    Average packs/day: 0.2 packs/day for 22.0 years (4.4 ttl pk-yrs)    Types: E-cigarettes, Cigarettes   Smokeless tobacco: Never   Tobacco comments:    electronic cig  Substance and Sexual Activity   Alcohol use: Yes    Alcohol/week: 0.0 - 2.0 standard drinks of alcohol    Comment: social   Drug use: Not Currently    Types: Marijuana    Comment: Rarely    Sexual activity: Yes    Partners: Male    Comment: declined condoms  Other Topics Concern   Not on file  Social History Narrative   Engaged.  Just bought a house together.   Social Drivers of Corporate investment banker Strain: Not on file  Food Insecurity: Not on file  Transportation Needs: Not on file  Physical Activity: Not on file  Stress: Not on file  Social Connections: Unknown (11/08/2021)   [facility]   Social Network    Social Network: Not on file    No Known  Allergies   Current Outpatient Medications:    ACCU-CHEK GUIDE test strip, AS DIRECTED IN VITRO ONCE A DAY (DX: E11.69) 90 DAYS (Patient not taking: Reported on 08/22/2022), Disp: , Rfl:    Accu-Chek Softclix Lancets lancets, daily. (Patient not taking: Reported on 08/22/2022), Disp: , Rfl:    atorvastatin (LIPITOR) 20 MG tablet, Take 20 mg by mouth daily., Disp: , Rfl:    bictegravir-emtricitabine-tenofovir AF (BIKTARVY) 50-200-25 MG TABS tablet, TAKE 1 TABLET BY MOUTH DAILY., Disp: 30 tablet, Rfl: 0   Blood Glucose Monitoring Suppl (ACCU-CHEK GUIDE ME) w/Device KIT, See admin instructions. (Patient not taking: Reported on 08/22/2022), Disp: , Rfl:    cyclobenzaprine (FLEXERIL) 10 MG tablet, Take 1 tablet (10 mg total) by mouth 3 (three) times daily as needed for muscle spasms. (Patient not taking: Reported on 08/22/2022), Disp: 30 tablet, Rfl: 0   diclofenac Sodium (VOLTAREN) 1 % GEL, See admin instructions. (Patient not taking: Reported on 08/22/2022), Disp: , Rfl:    gabapentin (NEURONTIN) 100 MG capsule, 1 capsule Orally at bedtime for 30 day(s), Disp: , Rfl:    lisinopril (ZESTRIL) 10 MG tablet, TAKE 1 TABLET (10 MG TOTAL) BY MOUTH DAILY., Disp: 30 tablet, Rfl: 0   metFORMIN (GLUCOPHAGE) 500 MG tablet, Take  500 mg by mouth in the morning and at bedtime., Disp: , Rfl:    Naproxen Sodium 220 MG CAPS, Take by mouth.  , Disp: , Rfl:    OZEMPIC, 0.25 OR 0.5 MG/DOSE, 2 MG/3ML SOPN, Inject into the skin., Disp: , Rfl:    valACYclovir (VALTREX) 1000 MG tablet, TAKE 1 TABLET BY MOUTH DAILY. THEN TAKE 1 TWICE DAILY WITH HSV OUTBREAK, Disp: 180 tablet, Rfl: 0  Current Facility-Administered Medications:    methylPREDNISolone acetate (DEPO-MEDROL) injection 80 mg, 80 mg, Intramuscular, Once, Carmelina Dane, MD   Review of Systems     Objective:   Physical Exam        Assessment & Plan:

## 2023-07-17 ENCOUNTER — Ambulatory Visit: Payer: Medicaid Other | Admitting: Infectious Disease

## 2023-07-17 DIAGNOSIS — Z7185 Encounter for immunization safety counseling: Secondary | ICD-10-CM

## 2023-07-17 DIAGNOSIS — E1165 Type 2 diabetes mellitus with hyperglycemia: Secondary | ICD-10-CM

## 2023-07-17 DIAGNOSIS — E785 Hyperlipidemia, unspecified: Secondary | ICD-10-CM

## 2023-07-17 DIAGNOSIS — I10 Essential (primary) hypertension: Secondary | ICD-10-CM

## 2023-07-17 DIAGNOSIS — B20 Human immunodeficiency virus [HIV] disease: Secondary | ICD-10-CM

## 2023-07-17 DIAGNOSIS — K760 Fatty (change of) liver, not elsewhere classified: Secondary | ICD-10-CM

## 2023-07-17 DIAGNOSIS — B009 Herpesviral infection, unspecified: Secondary | ICD-10-CM

## 2023-07-17 NOTE — Progress Notes (Unsigned)
Subjective:  Chief complaint: Follow-up for HIV disease on medications   Patient ID: Timothy Perry, male    DOB: 1976/05/14, 48 y.o.   MRN: [id number]  HPI  Discussed the use of AI scribe software for clinical note transcription with the patient, who gave verbal consent to proceed.  History of Present Illness   The patient, with a history of HIV, type 2 diabetes, and herpes simplex, presents for a routine follow-up. They report chest pain last Summer, which they initially attributed to anxiety and marijuana use. The chest pain had first started after  they had COVID-19 in December 2023, Patient had a reportedly normal EKG with PCP and the patient has an upcoming appointment with a cardiologist to further evaluate these symptoms.  The patient's anxiety peaked around the same time as their chest pain began, which coincided with a stressful period at their previous job. They have since left that job and are now working as an Biomedical scientist, which they report has helped to alleviate some of their stress. However, they acknowledge that they need to be more active as their current job is sedentary.  The patient also mentions plans to move to either [place] or [place] within the next six to nine months. They are currently in the process of making final decisions regarding this move.        Past Medical History:  Diagnosis Date   Fatty liver 02/11/2020   Herpes infection 02/11/2020   HIV (human immunodeficiency virus infection) (HCC) 03/26/2009   RCID;  participant in START Study   Hyperlipidemia 08/10/2021   Hypertension 01/24/2021   Priapism    occasional, mornings   Substance abuse (HCC)    Type 2 diabetes mellitus with hyperglycemia (HCC) 01/24/2021   Vaccine counseling 08/10/2021   Vesicular rash 01/08/2019   Weight gain 08/10/2021    No past surgical history on file.  Family History  Problem Relation Age of Onset   Thyroid disease Mother    Asthma Brother       Social  History   Socioeconomic History   Marital status: Single    Spouse name: [name]   Number of children: 0   Years of education: 15   Highest education level: Not on file  Occupational History   Occupation: Substance Abuse Counselor   Tobacco Use   Smoking status: Every Day    Current packs/day: 0.20    Average packs/day: 0.2 packs/day for 22.0 years (4.4 ttl pk-yrs)    Types: E-cigarettes, Cigarettes   Smokeless tobacco: Never   Tobacco comments:    electronic cig  Substance and Sexual Activity   Alcohol use: Yes    Alcohol/week: 0.0 - 2.0 standard drinks of alcohol    Comment: social   Drug use: Not Currently    Types: Marijuana    Comment: Rarely    Sexual activity: Yes    Partners: Male    Comment: declined condoms  Other Topics Concern   Not on file  Social History Narrative   Engaged.  Just bought a house together.   Social Drivers of Corporate investment banker Strain: Not on file  Food Insecurity: Not on file  Transportation Needs: Not on file  Physical Activity: Not on file  Stress: Not on file  Social Connections: Unknown (11/08/2021)   Received from [facility], [facility]   Social Network    Social Network: Not on file    No Known Allergies   Current Outpatient  Medications:    ACCU-CHEK GUIDE test strip, AS DIRECTED IN VITRO ONCE A DAY (DX: E11.69) 90 DAYS (Patient not taking: Reported on 08/22/2022), Disp: , Rfl:    Accu-Chek Softclix Lancets lancets, daily. (Patient not taking: Reported on 08/22/2022), Disp: , Rfl:    atorvastatin (LIPITOR) 20 MG tablet, Take 20 mg by mouth daily., Disp: , Rfl:    bictegravir-emtricitabine-tenofovir AF (BIKTARVY) 50-200-25 MG TABS tablet, TAKE 1 TABLET BY MOUTH DAILY., Disp: 30 tablet, Rfl: 0   gabapentin (NEURONTIN) 100 MG capsule, 1 capsule Orally at bedtime for 30 day(s), Disp: , Rfl:    lisinopril (ZESTRIL) 10 MG tablet, TAKE 1 TABLET (10 MG TOTAL) BY MOUTH DAILY., Disp: 30 tablet, Rfl: 0   metFORMIN  (GLUCOPHAGE) 500 MG tablet, Take 500 mg by mouth in the morning and at bedtime., Disp: , Rfl:    Naproxen Sodium 220 MG CAPS, Take by mouth.  , Disp: , Rfl:    OZEMPIC, 0.25 OR 0.5 MG/DOSE, 2 MG/3ML SOPN, Inject into the skin., Disp: , Rfl:    valACYclovir (VALTREX) 1000 MG tablet, TAKE 1 TABLET BY MOUTH DAILY. THEN TAKE 1 TWICE DAILY WITH HSV OUTBREAK, Disp: 180 tablet, Rfl: 0  Current Facility-Administered Medications:    methylPREDNISolone acetate (DEPO-MEDROL) injection 80 mg, 80 mg, Intramuscular, Once, Carmelina Dane, MD    Review of Systems  Constitutional:  Negative for activity change, appetite change, chills, diaphoresis, fatigue, fever and unexpected weight change.  HENT:  Negative for congestion, rhinorrhea, sinus pressure, sneezing, sore throat and trouble swallowing.   Eyes:  Negative for photophobia and visual disturbance.  Respiratory:  Negative for cough, chest tightness, shortness of breath, wheezing and stridor.   Cardiovascular:  Negative for chest pain, palpitations and leg swelling.  Gastrointestinal:  Negative for abdominal distention, abdominal pain, anal bleeding, blood in stool, constipation, diarrhea, nausea and vomiting.  Genitourinary:  Negative for difficulty urinating, dysuria, flank pain and hematuria.  Musculoskeletal:  Negative for arthralgias, back pain, gait problem, joint swelling and myalgias.  Skin:  Negative for color change, pallor, rash and wound.  Neurological:  Negative for dizziness, tremors, weakness and light-headedness.  Hematological:  Negative for adenopathy. Does not bruise/bleed easily.  Psychiatric/Behavioral:  Negative for agitation, behavioral problems, confusion, decreased concentration, dysphoric mood and sleep disturbance.        Objective:   Physical Exam Constitutional:      Appearance: He is well-developed.  HENT:     Head: Normocephalic and atraumatic.  Eyes:     Conjunctiva/sclera: Conjunctivae normal.   Cardiovascular:     Rate and Rhythm: Normal rate and regular rhythm.  Pulmonary:     Effort: Pulmonary effort is normal. No respiratory distress.     Breath sounds: No wheezing.  Abdominal:     General: There is no distension.     Palpations: Abdomen is soft.  Musculoskeletal:        General: No tenderness. Normal range of motion.     Cervical back: Normal range of motion and neck supple.  Skin:    General: Skin is warm and dry.     Coloration: Skin is not pale.     Findings: No erythema or rash.  Neurological:     General: No focal deficit present.     Mental Status: He is alert and oriented to person, place, and time.  Psychiatric:        Mood and Affect: Mood normal.        Behavior: Behavior normal.  Thought Content: Thought content normal.        Judgment: Judgment normal.           Assessment & Plan:   Assessment and Plan    HIV Undetectable viral load and healthy CD4 count of 1049 on Biktarvy. Discussed potential switch to long-acting therapy (Cabenuva or CAB + bNAB via Embrace study) but deferred due to potential upcoming move. -Continue Biktarvy. -Consider long-acting therapy after relocation.  Type 2 Diabetes On Metformin and Ozempic. Fasting glucose slightly elevated at 115. -Continue Metformin and Ozempic. -Monitor blood glucose levels.  Hyperlipidemia On Atorvastatin 20mg . LDL cholesterol well controlled at 24, but HDL slightly low at 33. -Continue Atorvastatin 20mg  daily. -Consider lifestyle modifications to increase HDL (e.g., fish oil).  Fatty Liver Disease Previous diagnosis, but recent normal liver function tests. Discussed potential benefit of weight loss and Ozempic. -Continue Ozempic. -Encourage continued weight loss.  General Health Maintenance -Administered flu and COVID-19 vaccines. -Perform anal Pap smear for rectal cancer screening. -Schedule colonoscopy for colon cancer screening.      Interimittently elevated bilirubin:  suspect Gilbert's

## 2023-07-18 ENCOUNTER — Other Ambulatory Visit (HOSPITAL_COMMUNITY)
Admission: RE | Admit: 2023-07-18 | Discharge: 2023-07-18 | Disposition: A | Discharge status: Home / Self Care | Payer: 59 | Source: Ambulatory Visit | Attending: Infectious Disease | Admitting: Infectious Disease

## 2023-07-18 ENCOUNTER — Ambulatory Visit (INDEPENDENT_AMBULATORY_CARE_PROVIDER_SITE_OTHER): Payer: Medicaid Other | Admitting: Infectious Disease

## 2023-07-18 ENCOUNTER — Other Ambulatory Visit: Payer: Self-pay

## 2023-07-18 VITALS — BP 130/83 | HR 96 | Temp 98.5°F | Wt 258.2 lb

## 2023-07-18 DIAGNOSIS — B20 Human immunodeficiency virus [HIV] disease: Secondary | ICD-10-CM | POA: Diagnosis present

## 2023-07-18 DIAGNOSIS — Z7185 Encounter for immunization safety counseling: Secondary | ICD-10-CM

## 2023-07-18 DIAGNOSIS — K7581 Nonalcoholic steatohepatitis (NASH): Secondary | ICD-10-CM

## 2023-07-18 DIAGNOSIS — Z7984 Long term (current) use of oral hypoglycemic drugs: Secondary | ICD-10-CM

## 2023-07-18 DIAGNOSIS — Z113 Encounter for screening for infections with a predominantly sexual mode of transmission: Secondary | ICD-10-CM

## 2023-07-18 DIAGNOSIS — E119 Type 2 diabetes mellitus without complications: Secondary | ICD-10-CM

## 2023-07-18 DIAGNOSIS — Z23 Encounter for immunization: Secondary | ICD-10-CM | POA: Diagnosis not present

## 2023-07-18 DIAGNOSIS — K760 Fatty (change of) liver, not elsewhere classified: Secondary | ICD-10-CM | POA: Diagnosis not present

## 2023-07-18 DIAGNOSIS — R0789 Other chest pain: Secondary | ICD-10-CM

## 2023-07-18 DIAGNOSIS — E1165 Type 2 diabetes mellitus with hyperglycemia: Secondary | ICD-10-CM

## 2023-07-18 DIAGNOSIS — F419 Anxiety disorder, unspecified: Secondary | ICD-10-CM

## 2023-07-18 DIAGNOSIS — Z7985 Long-term (current) use of injectable non-insulin antidiabetic drugs: Secondary | ICD-10-CM

## 2023-07-18 DIAGNOSIS — B009 Herpesviral infection, unspecified: Secondary | ICD-10-CM

## 2023-07-18 DIAGNOSIS — E785 Hyperlipidemia, unspecified: Secondary | ICD-10-CM

## 2023-07-18 MED ORDER — ATORVASTATIN CALCIUM 20 MG PO TABS: 20.0000 mg | ORAL_TABLET | Freq: Every day | ORAL | 11 refills | Status: AC

## 2023-07-18 MED ORDER — BICTEGRAVIR-EMTRICITAB-TENOFOV 50-200-25 MG PO TABS: 1.0000 | ORAL_TABLET | Freq: Every day | ORAL | 0 refills | Status: DC

## 2023-07-18 NOTE — Addendum Note (Signed)
Addended by: Juanita Laster on: 07/18/2023 02:59 PM   Modules accepted: Orders

## 2023-07-19 LAB — CYTOLOGY, (ORAL, ANAL, URETHRAL) ANCILLARY ONLY
Chlamydia: NEGATIVE
Chlamydia: NEGATIVE
Comment: NEGATIVE
Comment: NEGATIVE
Comment: NORMAL
Comment: NORMAL
Neisseria Gonorrhea: NEGATIVE
Neisseria Gonorrhea: NEGATIVE

## 2023-07-24 LAB — CYTOLOGY - PAP: Diagnosis: NEGATIVE

## 2023-08-17 ENCOUNTER — Encounter: Payer: Self-pay | Admitting: Internal Medicine

## 2023-08-17 ENCOUNTER — Ambulatory Visit: Payer: 59 | Attending: Internal Medicine | Admitting: Internal Medicine

## 2023-08-17 ENCOUNTER — Ambulatory Visit (INDEPENDENT_AMBULATORY_CARE_PROVIDER_SITE_OTHER): Payer: 59

## 2023-08-17 VITALS — BP 120/80 | HR 91 | Ht 71.0 in | Wt 251.0 lb

## 2023-08-17 DIAGNOSIS — R072 Precordial pain: Secondary | ICD-10-CM | POA: Diagnosis not present

## 2023-08-17 DIAGNOSIS — N182 Chronic kidney disease, stage 2 (mild): Secondary | ICD-10-CM

## 2023-08-17 DIAGNOSIS — Z6834 Body mass index (BMI) 34.0-34.9, adult: Secondary | ICD-10-CM

## 2023-08-17 DIAGNOSIS — E1159 Type 2 diabetes mellitus with other circulatory complications: Secondary | ICD-10-CM | POA: Diagnosis not present

## 2023-08-17 DIAGNOSIS — E785 Hyperlipidemia, unspecified: Secondary | ICD-10-CM

## 2023-08-17 DIAGNOSIS — E118 Type 2 diabetes mellitus with unspecified complications: Secondary | ICD-10-CM | POA: Diagnosis not present

## 2023-08-17 DIAGNOSIS — R002 Palpitations: Secondary | ICD-10-CM

## 2023-08-17 DIAGNOSIS — I152 Hypertension secondary to endocrine disorders: Secondary | ICD-10-CM

## 2023-08-17 DIAGNOSIS — E1122 Type 2 diabetes mellitus with diabetic chronic kidney disease: Secondary | ICD-10-CM

## 2023-08-17 DIAGNOSIS — B20 Human immunodeficiency virus [HIV] disease: Secondary | ICD-10-CM

## 2023-08-17 DIAGNOSIS — E1169 Type 2 diabetes mellitus with other specified complication: Secondary | ICD-10-CM | POA: Diagnosis not present

## 2023-08-17 MED ORDER — EMPAGLIFLOZIN 10 MG PO TABS: 10.0000 mg | ORAL_TABLET | Freq: Every day | ORAL | 11 refills | Status: AC

## 2023-08-17 MED ORDER — ASPIRIN 81 MG PO TBEC: 81.0000 mg | DELAYED_RELEASE_TABLET | Freq: Every day | ORAL | Status: AC

## 2023-08-17 MED ORDER — METOPROLOL TARTRATE 100 MG PO TABS: 100.0000 mg | ORAL_TABLET | Freq: Once | ORAL | 0 refills | Status: AC

## 2023-08-17 NOTE — Progress Notes (Unsigned)
Applied a 3 day Zio XT monitor to patient in the office 

## 2023-08-17 NOTE — Patient Instructions (Addendum)
Medication Instructions:  Your physician has recommended you make the following change in your medication:   1) START aspirin 81 mg daily (you can purchase this over the counter) 2) START empagliflozin (Jardiance) 10 mg daily  *If you need a refill on your cardiac medications before your next appointment, please call your pharmacy*  Lab Work: Next week at [facility]: Lipid panel, CMP, LP(a) You may also go to any of these [facility] locations:   [facility] - [address] ([facility]) - [address] [phone] [address]   [place] - [address]    [place]  - [address]    [place] - [address] - [address] [employer]  - [address] ([facility]  If you have labs (blood work) drawn today and your tests are completely normal, you will receive your results only by: [employer] (if you have MyChart) OR A paper copy in the mail If you have any lab test that is abnormal or we need to change your treatment, we will call you to review the results.  Testing/Procedures: Your physician has requested that you have cardiac CT. Cardiac computed tomography (CT) is a painless test that uses an x-ray machine to take clear, detailed pictures of your heart. For further information please visit [email or web address] Please follow instruction sheet as given.  Your physician has requested that you have an echocardiogram. Echocardiography is a painless test that uses sound waves to create images of your heart. It provides your doctor with information about the size and shape of your heart and how well your heart's chambers and valves are working. This procedure takes approximately one hour. There are no restrictions for this procedure. Please do NOT wear cologne, perfume, aftershave, or lotions (deodorant is allowed). Please arrive 15 minutes prior to  your appointment time.  Please note: We ask at that you not bring children with you during ultrasound (echo/ vascular) testing. Due to room size and safety concerns, children are not allowed in the ultrasound rooms during exams. Our front office staff cannot provide observation of children in our lobby area while testing is being conducted. An adult accompanying a patient to their appointment will only be allowed in the ultrasound room at the discretion of the ultrasound technician under special circumstances. We apologize for any inconvenience.  Your physician has requested that you wear a Zio heart monitor for 3 days. Please allow 2 weeks after returning the heart monitor before our office calls you with the results.  Follow-Up: At [facility], you and your health needs are our priority.  As part of our continuing mission to provide you with exceptional heart care, we have created designated Provider Care Teams.  These Care Teams include your primary Cardiologist (physician) and Advanced Practice Providers (APPs -  Physician Assistants and Nurse Practitioners) who all work together to provide you with the care you need, when you need it.  Your next appointment:   As needed  The format for your next appointment:   In Person  Provider:   Orbie Pyo, MD {  Other Instructions   Your cardiac CT will be scheduled at:   [facility] [address] [place], [place] [address] [phone]  Please arrive at the [facility] and Children's Entrance (Entrance C2) of [facility] 30 minutes prior to  test start time.  You can use the FREE valet parking offered at entrance C (encouraged to control the heart rate for the test).  Proceed to the [facility] Radiology Department (first floor) to check-in and test prep.  All radiology patients and guests should use entrance C2 at [facility], accessed from [facility], even though the hospital's physical address  listed is [address].   Please follow these instructions carefully (unless otherwise directed):  An IV will be required for this test and Nitroglycerin will be given.  Hold all erectile dysfunction medications at least 3 days (72 hrs) prior to test. (Ie viagra, cialis, sildenafil, tadalafil, etc)   On the Night Before the Test: Be sure to Drink plenty of water. Do not consume any caffeinated/decaffeinated beverages or chocolate 12 hours prior to your test. Do not take any antihistamines 12 hours prior to your test.  On the Day of the Test: Drink plenty of water until 1 hour prior to the test. Do not eat any food 1 hour prior to test. You may take your regular medications prior to the test.  Take metoprolol (Lopressor) 100 mg two hours prior to test. If you take Furosemide/Hydrochlorothiazide/Spironolactone/Chlorthalidone, please HOLD on the morning of the test. Patients who wear a continuous glucose monitor MUST remove the device prior to scanning.  After the Test: Drink plenty of water. After receiving IV contrast, you may experience a mild flushed feeling. This is normal. On occasion, you may experience a mild rash up to 24 hours after the test. This is not dangerous. If this occurs, you can take Benadryl 25 mg, Zyrtec, Claritin, or Allegra and increase your fluid intake. (Patients taking Tikosyn should avoid Benadryl, and may take Zyrtec, Claritin, or Allegra) If you experience trouble breathing, this can be serious. If it is severe call 911 IMMEDIATELY. If it is mild, please call our office.  We will call to schedule your test 2-4 weeks out understanding that some insurance companies will need an authorization prior to the service being performed.   For more information and frequently asked questions, please visit our website : [email or web address]  For non-scheduling related questions, please contact the cardiac imaging nurse navigator should you have  any questions/concerns: Cardiac Imaging Nurse Navigators Direct Office Dial: [phone]   For scheduling needs, including cancellations and rescheduling, please call [place], [phone].

## 2023-08-17 NOTE — Progress Notes (Signed)
Cardiology Office Note:   Date:  08/17/2023  ID:  Timothy Perry, DOB [birth year], MRN [id number] PCP:  Milus Height, PA  [facility] Providers Cardiologist:  Alverda Skeans, MD Referring MD: Milus Height, [place]  Chief Complaint/Reason for Referral: Chest pain ASSESSMENT:    1. Precordial pain   2. Type 2 diabetes mellitus with complication, without long-term current use of insulin (HCC)   3. Hypertension associated with diabetes (HCC)   4. Hyperlipidemia associated with type 2 diabetes mellitus (HCC)   5. CKD stage 2 due to type 2 diabetes mellitus (HCC)   6. HIV disease (HCC)   7. BMI 34.0-34.9,adult   8. Palpitations     PLAN:   In order of problems listed above: Chest pain: We will obtain a coronary CTA and echocardiogram to evaluate further.  If the patient has mild obstructive coronary artery disease, they will require a statin (with goal LDL < 70) and aspirin, if they have high-grade disease we will need to consider optimal medical therapy and if symptoms are refractory to medical therapy, then a cardiac catheterization with possible PCI will be pursued to alleviate symptoms.  If they have high risk disease we will proceed directly to cardiac catheterization.   Type 2 diabetes mellitus: Continue lisinopril 10 mg, atorvastatin 20 mg, start aspirin 81 mg, start Jardiance 10 mg.  Continue Ozempic. Hypertension: Continue lisinopril 10mg , BP well controlled today.    Hyperlipidemia: LDL recently was 24.   CKD stage II: Continue lisinopril 10mg  and start Jardiance 10 mg for renal protection. HIV: Continue Biktarvy. Elevated BMI: Continue Ozempic. Palpitations: Will obtain echocardiogram and monitor to evaluate further.            Dispo:  Return if symptoms worsen or fail to improve.      Medication Adjustments/Labs and Tests Ordered: Current medicines are reviewed at length with the patient today.  Concerns regarding medicines are outlined above.  The following  changes have been made:     Labs/tests ordered: Orders Placed This Encounter  Procedures   CT CORONARY MORPH W/CTA COR W/SCORE W/CA W/CM &/OR WO/CM   Comprehensive metabolic panel   Lipid panel   Lipoprotein A (LPA)   LONG TERM MONITOR (3-14 DAYS)   EKG 12-Lead   ECHOCARDIOGRAM COMPLETE    Medication Changes: Meds ordered this encounter  Medications   metoprolol tartrate (LOPRESSOR) 100 MG tablet    Sig: Take 1 tablet (100 mg total) by mouth once for 1 dose. Take 90-120 minutes prior to scan. Hold for SBP less than 110.    Dispense:  1 tablet    Refill:  0   aspirin EC 81 MG tablet    Sig: Take 1 tablet (81 mg total) by mouth daily. Swallow whole.   empagliflozin (JARDIANCE) 10 MG TABS tablet    Sig: Take 1 tablet (10 mg total) by mouth daily before breakfast.    Dispense:  30 tablet    Refill:  11    Current medicines are reviewed at length with the patient today.  The patient does not have concerns regarding medicines.     History of Present Illness:      FOCUSED PROBLEM LIST:   Type II DM On metformin Hypertension Hyperlipidemia Hepatic steatosis Liver biopsy 2015 CKD stage II OSA On CPAP HIV On Biktarvy BMI 34 On Ozempic  2/25: The patient is a 48 year old male with above listed medical problems referred for recommendations regarding chest discomfort.  The patient was seen by his  primary care provider recently.  He unfortunately has had a stressful year.  His significant other died recently.  He has had job stress as well.  He does not check his blood pressures at home.  Apparently he has noted occasional chest discomfort at times.  The patient has noticed chest discomfort when driving to work.  He also has noticed dyspnea when he exerts himself.  These do not seem to happen together however.  He denies any paroxysmal nocturnal dyspnea, presyncope or syncope.  He has noted palpitations on occasion.  They do not last very long and they do not seem to be  associated with any dietary intake.  He does vape on occasion.        Current Medications: Current Meds  Medication Sig   aspirin EC 81 MG tablet Take 1 tablet (81 mg total) by mouth daily. Swallow whole.   empagliflozin (JARDIANCE) 10 MG TABS tablet Take 1 tablet (10 mg total) by mouth daily before breakfast.   metoprolol tartrate (LOPRESSOR) 100 MG tablet Take 1 tablet (100 mg total) by mouth once for 1 dose. Take 90-120 minutes prior to scan. Hold for SBP less than 110.   Current Facility-Administered Medications for the 08/17/23 encounter (Office Visit) with Orbie Pyo, MD  Medication   methylPREDNISolone acetate (DEPO-MEDROL) injection 80 mg     Review of Systems:   Please see the history of present illness.    All other systems reviewed and are negative.     EKGs/Labs/Other Test Reviewed:   EKG: EKG performed in 2016 demonstrates sinus rhythm  EKG Interpretation Date/Time:  Friday August 17 2023 16:08:50 EST Ventricular Rate:  97 PR Interval:  164 QRS Duration:  94 QT Interval:  342 QTC Calculation: 434 R Axis:   74  Text Interpretation: Normal sinus rhythm Normal ECG No previous ECGs available Confirmed by Alverda Skeans ([id number]) on 08/17/2023 4:12:47 PM         Risk Assessment/Calculations:          Physical Exam:   VS:  BP 120/80   Pulse 91   Ht 5\' 11"  (1.803 m)   Wt 251 lb (113.9 kg)   SpO2 95%   BMI 35.01 kg/m        Wt Readings from Last 3 Encounters:  08/17/23 251 lb (113.9 kg)  07/18/23 258 lb 3.2 oz (117.1 kg)  01/04/22 266 lb 3.2 oz (120.7 kg)      GENERAL:  No apparent distress, AOx3 HEENT:  No carotid bruits, +2 carotid impulses, no scleral icterus CAR: RRR no murmurs, gallops, rubs, or thrills RES:  Clear to auscultation bilaterally ABD:  Soft, nontender, nondistended, positive bowel sounds x 4 VASC:  +2 radial pulses, +2 carotid pulses NEURO:  CN 2-12 grossly intact; motor and sensory grossly intact PSYCH:  No active depression  or anxiety EXT:  No edema, ecchymosis, or cyanosis  Signed, Orbie Pyo, MD  08/17/2023 4:59 PM    [facility] [address] [place], [place], [place]  [address] Phone: [phone]; Fax: [phone]   Note:  This document was prepared using Dragon voice recognition software and may include unintentional dictation errors.

## 2023-08-21 ENCOUNTER — Other Ambulatory Visit: Payer: Self-pay | Admitting: Infectious Disease

## 2023-08-23 ENCOUNTER — Telehealth: Payer: Self-pay

## 2023-08-23 ENCOUNTER — Other Ambulatory Visit (HOSPITAL_COMMUNITY): Payer: Self-pay

## 2023-08-23 NOTE — Telephone Encounter (Signed)
 Pharmacy Patient Advocate Encounter   Received notification from CoverMyMeds that prior authorization for JARDIANCE is required/requested.   Insurance verification completed.   The patient is insured through [facility] .   Per test claim: PA required; PA submitted to above mentioned insurance via CoverMyMeds Key/confirmation #/EOC [id number] Status is pending

## 2023-08-25 ENCOUNTER — Other Ambulatory Visit (HOSPITAL_COMMUNITY): Payer: Self-pay

## 2023-08-27 ENCOUNTER — Other Ambulatory Visit (HOSPITAL_COMMUNITY): Payer: Self-pay

## 2023-08-28 ENCOUNTER — Other Ambulatory Visit: Payer: Self-pay | Admitting: Infectious Disease

## 2023-08-28 DIAGNOSIS — B20 Human immunodeficiency virus [HIV] disease: Secondary | ICD-10-CM

## 2023-08-28 NOTE — Telephone Encounter (Signed)
 Pharmacy Patient Advocate Encounter  Received notification from [facility] that Prior Authorization for jardiance has been APPROVED from 08/23/23 to 08/22/24. Spoke to pharmacy to process.Copay is $4.00.    PA #/Case ID/Reference #: [id number]

## 2023-08-30 ENCOUNTER — Encounter: Payer: Self-pay | Admitting: Internal Medicine

## 2023-09-04 ENCOUNTER — Encounter (HOSPITAL_COMMUNITY): Payer: Self-pay

## 2023-09-04 ENCOUNTER — Ambulatory Visit: Payer: 59 | Admitting: Cardiology

## 2023-09-05 LAB — LIPID PANEL
Chol/HDL Ratio: 2.7 ratio (ref 0.0–5.0)
Cholesterol, Total: 81 mg/dL — ABNORMAL LOW (ref 100–199)
HDL: 30 mg/dL — ABNORMAL LOW (ref >39)
LDL Chol Calc (NIH): 26 mg/dL (ref 0–99)
Triglycerides: 143 mg/dL (ref 0–149)
VLDL Cholesterol Cal: 25 mg/dL (ref 5–40)

## 2023-09-05 LAB — COMPREHENSIVE METABOLIC PANEL
ALT: 42 IU/L (ref 0–44)
AST: 27 IU/L (ref 0–40)
Albumin: 4.1 g/dL (ref 4.1–5.1)
Alkaline Phosphatase: 101 IU/L (ref 44–121)
BUN/Creatinine Ratio: 8 — ABNORMAL LOW (ref 9–20)
BUN: 8 mg/dL (ref 6–24)
Bilirubin Total: 0.9 mg/dL (ref 0.0–1.2)
CO2: 21 mmol/L (ref 20–29)
Calcium: 8.9 mg/dL (ref 8.7–10.2)
Chloride: 106 mmol/L (ref 96–106)
Creatinine, Ser: 1.04 mg/dL (ref 0.76–1.27)
Globulin, Total: 2.4 g/dL (ref 1.5–4.5)
Glucose: 173 mg/dL — ABNORMAL HIGH (ref 70–99)
Potassium: 4.1 mmol/L (ref 3.5–5.2)
Sodium: 141 mmol/L (ref 134–144)
Total Protein: 6.5 g/dL (ref 6.0–8.5)
eGFR: 88 mL/min/{1.73_m2} (ref >59)

## 2023-09-05 LAB — LIPOPROTEIN A (LPA)

## 2023-09-06 ENCOUNTER — Encounter: Payer: Self-pay | Admitting: Internal Medicine

## 2023-09-06 ENCOUNTER — Ambulatory Visit (HOSPITAL_COMMUNITY)
Admission: RE | Admit: 2023-09-06 | Discharge: 2023-09-06 | Disposition: A | Discharge status: Home / Self Care | Payer: 59 | Source: Ambulatory Visit | Attending: Internal Medicine | Admitting: Internal Medicine

## 2023-09-06 DIAGNOSIS — R072 Precordial pain: Secondary | ICD-10-CM | POA: Diagnosis present

## 2023-09-06 MED ORDER — DILTIAZEM HCL 25 MG/5ML IV SOLN
INTRAVENOUS | Status: AC
  Filled 2023-09-06: qty 5

## 2023-09-06 MED ORDER — NITROGLYCERIN 0.4 MG SL SUBL
SUBLINGUAL_TABLET | SUBLINGUAL | Status: AC
  Filled 2023-09-06: qty 2

## 2023-09-06 MED ORDER — NITROGLYCERIN 0.4 MG SL SUBL
0.8000 mg | SUBLINGUAL_TABLET | Freq: Once | SUBLINGUAL | Status: AC
  Administered 2023-09-06: 0.8 mg via SUBLINGUAL

## 2023-09-06 MED ORDER — DILTIAZEM HCL 25 MG/5ML IV SOLN: 10.0000 mg | INTRAVENOUS | Status: DC | PRN

## 2023-09-06 MED ORDER — METOPROLOL TARTRATE 5 MG/5ML IV SOLN
INTRAVENOUS | Status: AC
  Filled 2023-09-06: qty 5

## 2023-09-06 MED ORDER — IOHEXOL 350 MG/ML SOLN
95.0000 mL | Freq: Once | INTRAVENOUS | Status: AC | PRN
  Administered 2023-09-06: 95 mL via INTRAVENOUS

## 2023-09-06 MED ORDER — METOPROLOL TARTRATE 5 MG/5ML IV SOLN
10.0000 mg | Freq: Once | INTRAVENOUS | Status: AC | PRN
  Administered 2023-09-06: 5 mg via INTRAVENOUS

## 2023-09-06 NOTE — Progress Notes (Signed)
 Pt tolerated nitroglycerin and scan with contrast. BP WNL. IV removed and pt did not endorse any dizziness.

## 2023-09-07 ENCOUNTER — Encounter: Payer: Self-pay | Admitting: Internal Medicine

## 2023-09-12 ENCOUNTER — Encounter: Payer: Self-pay | Admitting: Internal Medicine

## 2023-09-12 ENCOUNTER — Ambulatory Visit (HOSPITAL_COMMUNITY): Payer: 59 | Attending: Internal Medicine

## 2023-09-12 DIAGNOSIS — R072 Precordial pain: Secondary | ICD-10-CM

## 2023-09-12 LAB — ECHOCARDIOGRAM COMPLETE
Area-P 1/2: 3.6 cm2
S' Lateral: 3.1 cm

## 2023-10-26 ENCOUNTER — Other Ambulatory Visit: Payer: Self-pay

## 2023-10-26 DIAGNOSIS — B20 Human immunodeficiency virus [HIV] disease: Secondary | ICD-10-CM

## 2023-10-26 MED ORDER — BIKTARVY 50-200-25 MG PO TABS: 1.0000 | ORAL_TABLET | Freq: Every day | ORAL | 3 refills | Status: DC

## 2023-11-05 NOTE — Progress Notes (Signed)
 The ASCVD Risk score (Arnett DK, et al., 2019) failed to calculate for the following reasons:   The valid total cholesterol range is 130 to 320 mg/dL  Arlon Bergamo, BSN, RN

## 2024-04-25 ENCOUNTER — Other Ambulatory Visit: Payer: Self-pay | Admitting: Infectious Disease

## 2024-04-30 ENCOUNTER — Other Ambulatory Visit: Payer: Self-pay | Admitting: Infectious Disease

## 2024-04-30 DIAGNOSIS — B20 Human immunodeficiency virus [HIV] disease: Secondary | ICD-10-CM

## 2024-06-03 ENCOUNTER — Other Ambulatory Visit: Payer: Self-pay

## 2024-06-03 DIAGNOSIS — B20 Human immunodeficiency virus [HIV] disease: Secondary | ICD-10-CM

## 2024-06-10 ENCOUNTER — Encounter (INDEPENDENT_AMBULATORY_CARE_PROVIDER_SITE_OTHER): Payer: Self-pay

## 2024-06-12 ENCOUNTER — Other Ambulatory Visit: Payer: Self-pay

## 2024-06-12 ENCOUNTER — Other Ambulatory Visit (HOSPITAL_COMMUNITY)
Admission: RE | Admit: 2024-06-12 | Discharge: 2024-06-12 | Disposition: A | Source: Ambulatory Visit | Attending: Infectious Disease | Admitting: Infectious Disease

## 2024-06-12 ENCOUNTER — Other Ambulatory Visit

## 2024-06-12 DIAGNOSIS — Z113 Encounter for screening for infections with a predominantly sexual mode of transmission: Secondary | ICD-10-CM

## 2024-06-12 DIAGNOSIS — Z79899 Other long term (current) drug therapy: Secondary | ICD-10-CM | POA: Diagnosis present

## 2024-06-12 DIAGNOSIS — B20 Human immunodeficiency virus [HIV] disease: Secondary | ICD-10-CM

## 2024-06-13 LAB — T-HELPER CELL (CD4) - (RCID CLINIC ONLY)
CD4 % Helper T Cell: 52 % (ref 33–65)
CD4 T Cell Abs: 1095 /uL (ref 400–1790)

## 2024-06-14 LAB — COMPLETE METABOLIC PANEL WITHOUT GFR
AG Ratio: 1.5 (calc) (ref 1.0–2.5)
ALT: 47 U/L — ABNORMAL HIGH (ref 9–46)
AST: 34 U/L (ref 10–35)
Albumin: 4.5 g/dL (ref 3.6–5.1)
Alkaline phosphatase (APISO): 92 U/L (ref 35–144)
BUN: 12 mg/dL (ref 7–25)
CO2: 28 mmol/L (ref 20–32)
Calcium: 9.5 mg/dL (ref 8.6–10.3)
Chloride: 102 mmol/L (ref 98–110)
Creat: 1.01 mg/dL (ref 0.70–1.30)
Globulin: 3 g/dL (ref 1.9–3.7)
Glucose, Bld: 118 mg/dL — ABNORMAL HIGH (ref 65–99)
Potassium: 3.7 mmol/L (ref 3.5–5.3)
Sodium: 138 mmol/L (ref 135–146)
Total Bilirubin: 1.1 mg/dL (ref 0.2–1.2)
Total Protein: 7.5 g/dL (ref 6.1–8.1)

## 2024-06-14 LAB — CBC WITH DIFFERENTIAL/PLATELET
Absolute Lymphocytes: 2355 {cells}/uL (ref 850–3900)
Absolute Monocytes: 375 {cells}/uL (ref 200–950)
Basophils Absolute: 30 {cells}/uL (ref 0–200)
Basophils Relative: 0.4 %
Eosinophils Absolute: 113 {cells}/uL (ref 15–500)
Eosinophils Relative: 1.5 %
HCT: 45.2 % (ref 39.4–51.1)
Hemoglobin: 15.2 g/dL (ref 13.2–17.1)
MCH: 32.5 pg (ref 27.0–33.0)
MCHC: 33.6 g/dL (ref 31.6–35.4)
MCV: 96.6 fL (ref 81.4–101.7)
MPV: 10.2 fL (ref 7.5–12.5)
Monocytes Relative: 5 %
Neutro Abs: 4628 {cells}/uL (ref 1500–7800)
Neutrophils Relative %: 61.7 %
Platelets: 189 Thousand/uL (ref 140–400)
RBC: 4.68 Million/uL (ref 4.20–5.80)
RDW: 12.3 % (ref 11.0–15.0)
Total Lymphocyte: 31.4 %
WBC: 7.5 Thousand/uL (ref 3.8–10.8)

## 2024-06-14 LAB — LIPID PANEL
Cholesterol: 110 mg/dL
HDL: 32 mg/dL — ABNORMAL LOW
LDL Cholesterol (Calc): 50 mg/dL
Non-HDL Cholesterol (Calc): 78 mg/dL
Total CHOL/HDL Ratio: 3.4 (calc)
Triglycerides: 221 mg/dL — ABNORMAL HIGH

## 2024-06-14 LAB — SYPHILIS: RPR W/REFLEX TO RPR TITER AND TREPONEMAL ANTIBODIES, TRADITIONAL SCREENING AND DIAGNOSIS ALGORITHM: RPR Ser Ql: NONREACTIVE

## 2024-06-14 LAB — HIV-1 RNA QUANT-NO REFLEX-BLD
HIV 1 RNA Quant: NOT DETECTED {copies}/mL
HIV-1 RNA Quant, Log: NOT DETECTED {Log_copies}/mL

## 2024-06-16 LAB — URINE CYTOLOGY ANCILLARY ONLY
Chlamydia: NEGATIVE
Comment: NEGATIVE
Comment: NORMAL
Neisseria Gonorrhea: NEGATIVE

## 2024-06-30 NOTE — Progress Notes (Deleted)
 "  Subjective:  Chief complaint: follow-up for HIV disease on medications   Patient ID: Timothy Perry, male    DOB: 02-Sep-1975, 49 y.o.   MRN: [id number]  HPI  Past Medical History:  Diagnosis Date   DM (diabetes mellitus) (HCC)    Elevated transaminase level    Fatty liver 02/11/2020   Herpes infection 02/11/2020   High cholesterol    History of degenerative disc disease    History of shingles    HIV (human immunodeficiency virus infection) (HCC) 03/26/2009   RCID;  participant in START Study   Hyperlipidemia 08/10/2021   Hypertension 01/24/2021   Morbid obesity (HCC)    OSA (obstructive sleep apnea)    Priapism    occasional, mornings   Substance abuse (HCC)    Type 2 diabetes mellitus with hyperglycemia (HCC) 01/24/2021   Vaccine counseling 08/10/2021   Vesicular rash 01/08/2019   Weight gain 08/10/2021    No past surgical history on file.  Family History  Problem Relation Age of Onset   Hypertension Mother    Thyroid disease Mother    Barrett's esophagus Mother    Asthma Brother    Liver cancer Maternal Grandmother    Liver disease Maternal Grandfather       Social History   Socioeconomic History   Marital status: Single    Spouse name: [name]   Number of children: 0   Years of education: 15   Highest education level: Not on file  Occupational History   Occupation: Substance Abuse Counselor   Tobacco Use   Smoking status: Every Day    Current packs/day: 0.20    Average packs/day: 0.2 packs/day for 22.0 years (4.4 ttl pk-yrs)    Types: E-cigarettes, Cigarettes   Smokeless tobacco: Never   Tobacco comments:    electronic cig  Substance and Sexual Activity   Alcohol use: Yes    Alcohol/week: 0.0 - 2.0 standard drinks of alcohol    Comment: social   Drug use: Not Currently    Types: Marijuana    Comment: Rarely    Sexual activity: Yes    Partners: Male    Comment: declined condoms  Other Topics Concern   Not on file  Social History  Narrative   Engaged.  Just bought a house together.   Social Drivers of Health   Tobacco Use: High Risk (08/17/2023)   Patient History    Smoking Tobacco Use: Every Day    Smokeless Tobacco Use: Never    Passive Exposure: Not on file  Financial Resource Strain: Not on file  Food Insecurity: Not on file  Transportation Needs: Not on file  Physical Activity: Not on file  Stress: Not on file  Social Connections: Unknown (11/08/2021)   Received from [facility]   Social Network    Social Network: Not on file  Depression (PHQ2-9): Low Risk (07/18/2023)   Depression (PHQ2-9)    PHQ-2 Score: 0  Alcohol Screen: Not on file  Housing: Not on file  Utilities: Not on file  Health Literacy: Not on file    Allergies[1]  Current Medications[2]    Review of Systems     Objective:   Physical Exam        Assessment & Plan:       [1] No Known Allergies [2]  Current Outpatient Medications:    ACCU-CHEK GUIDE test strip, AS DIRECTED IN VITRO ONCE A DAY (DX: E11.69) 90 DAYS (Patient not taking: Reported on 07/18/2023), Disp: , Rfl:  Accu-Chek Softclix Lancets lancets, daily. (Patient not taking: Reported on 07/18/2023), Disp: , Rfl:    aspirin  EC 81 MG tablet, Take 1 tablet (81 mg total) by mouth daily. Swallow whole., Disp: , Rfl:    atorvastatin  (LIPITOR) 20 MG tablet, Take 1 tablet (20 mg total) by mouth daily., Disp: 30 tablet, Rfl: 11   BIKTARVY  50-200-25 MG TABS tablet, TAKE 1 TABLET BY MOUTH EVERY DAY, Disp: 30 tablet, Rfl: 3   cholecalciferol (VITAMIN D3) 25 MCG (1000 UNIT) tablet, Take 1,000 Units by mouth daily., Disp: , Rfl:    Coenzyme Q10 (COQ10) 100 MG CAPS, Take by mouth., Disp: , Rfl:    empagliflozin  (JARDIANCE ) 10 MG TABS tablet, Take 1 tablet (10 mg total) by mouth daily before breakfast., Disp: 30 tablet, Rfl: 11   lisinopril  (ZESTRIL ) 10 MG tablet, TAKE 1 TABLET (10 MG TOTAL) BY MOUTH DAILY., Disp: 30 tablet, Rfl: 0   metFORMIN (GLUCOPHAGE) 500 MG tablet,  Take 500 mg by mouth in the morning and at bedtime., Disp: , Rfl:    metoprolol  tartrate (LOPRESSOR ) 100 MG tablet, Take 1 tablet (100 mg total) by mouth once for 1 dose. Take 90-120 minutes prior to scan. Hold for SBP less than 110., Disp: 1 tablet, Rfl: 0   Multiple Vitamin (MULTIVITAMIN) tablet, Take 1 tablet by mouth daily., Disp: , Rfl:    Naproxen  Sodium 220 MG CAPS, Take by mouth.  , Disp: , Rfl:    OZEMPIC, 0.25 OR 0.5 MG/DOSE, 2 MG/3ML SOPN, Inject into the skin., Disp: , Rfl:    valACYclovir  (VALTREX ) 1000 MG tablet, TAKE 1 TABLET BY MOUTH DAILY. THEN TAKE 1 TWICE DAILY WITH HSV OUTBREAK, Disp: 180 tablet, Rfl: 1  Current Facility-Administered Medications:    methylPREDNISolone  acetate (DEPO-MEDROL ) injection 80 mg, 80 mg, Intramuscular, Once, Lenon Juliane RAMAN, MD  "

## 2024-07-02 ENCOUNTER — Telehealth: Payer: Self-pay

## 2024-07-02 ENCOUNTER — Ambulatory Visit: Payer: Self-pay | Admitting: Infectious Disease

## 2024-07-02 DIAGNOSIS — K760 Fatty (change of) liver, not elsewhere classified: Secondary | ICD-10-CM

## 2024-07-02 DIAGNOSIS — B009 Herpesviral infection, unspecified: Secondary | ICD-10-CM

## 2024-07-02 DIAGNOSIS — E1165 Type 2 diabetes mellitus with hyperglycemia: Secondary | ICD-10-CM

## 2024-07-02 DIAGNOSIS — B20 Human immunodeficiency virus [HIV] disease: Secondary | ICD-10-CM

## 2024-07-02 DIAGNOSIS — E785 Hyperlipidemia, unspecified: Secondary | ICD-10-CM

## 2024-07-02 DIAGNOSIS — R238 Other skin changes: Secondary | ICD-10-CM

## 2024-07-02 DIAGNOSIS — I10 Essential (primary) hypertension: Secondary | ICD-10-CM

## 2024-07-02 NOTE — Telephone Encounter (Signed)
 Patient has reschedule Dr. Fleeta Dam's appointment, requesting medication refill on Biktarvy .

## 2024-07-07 ENCOUNTER — Ambulatory Visit: Payer: Self-pay | Admitting: Infectious Disease

## 2024-07-11 ENCOUNTER — Other Ambulatory Visit: Payer: Self-pay

## 2024-07-11 ENCOUNTER — Encounter: Payer: Self-pay | Admitting: *Deleted

## 2024-07-11 VITALS — BP 119/83 | HR 75 | Temp 98.1°F | Wt 256.6 lb

## 2024-07-11 DIAGNOSIS — Z006 Encounter for examination for normal comparison and control in clinical research program: Secondary | ICD-10-CM

## 2024-07-11 NOTE — Research (Signed)
 Tod seen for screening visit for [phone] [facility]) study: Phase 2, multicenter, randomized, double-blinded, placebo-controlled trial to evaluate safety, tolerability and efficacy of dasatinib and quercetin together in improving physical function measurements in people with HIV who are frail or prefrail. After verifying the correct consent version, I explained/reviewed the informed consent with him. Risk, benefits, responsibilities and other options were discussed. I answered his questions. He verbalized understanding and signed the consent witnessed by me.  Signed informed consent was obtained prior to any procedures being done. All study procedures completed per protocol. His entry visit is scheduled for [id number] pending eligibility confirmation.

## 2024-07-15 LAB — CBC WITH DIFFERENTIAL/PLATELET
Absolute Lymphocytes: 1434 {cells}/uL (ref 850–3900)
Absolute Monocytes: 356 {cells}/uL (ref 200–950)
Basophils Absolute: 22 {cells}/uL (ref 0–200)
Basophils Relative: 0.5 %
Eosinophils Absolute: 110 {cells}/uL (ref 15–500)
Eosinophils Relative: 2.5 %
HCT: 40.6 % (ref 39.4–51.1)
Hemoglobin: 13.2 g/dL (ref 13.2–17.1)
MCH: 32.1 pg (ref 27.0–33.0)
MCHC: 32.5 g/dL (ref 31.6–35.4)
MCV: 98.8 fL (ref 81.4–101.7)
MPV: 10.8 fL (ref 7.5–12.5)
Monocytes Relative: 8.1 %
Neutro Abs: 2477 {cells}/uL (ref 1500–7800)
Neutrophils Relative %: 56.3 %
Platelets: 181 Thousand/uL (ref 140–400)
RBC: 4.11 Million/uL — ABNORMAL LOW (ref 4.20–5.80)
RDW: 13.4 % (ref 11.0–15.0)
Total Lymphocyte: 32.6 %
WBC: 4.4 Thousand/uL (ref 3.8–10.8)

## 2024-07-15 LAB — COMPREHENSIVE METABOLIC PANEL WITH GFR
AG Ratio: 1.4 (calc) (ref 1.0–2.5)
ALT: 44 U/L (ref 9–46)
AST: 30 U/L (ref 10–35)
Albumin: 4.2 g/dL (ref 3.6–5.1)
Alkaline phosphatase (APISO): 103 U/L (ref 35–144)
BUN: 8 mg/dL (ref 7–25)
CO2: 25 mmol/L (ref 20–32)
Calcium: 8.8 mg/dL (ref 8.6–10.3)
Chloride: 107 mmol/L (ref 98–110)
Creat: 0.87 mg/dL (ref 0.70–1.30)
Globulin: 2.9 g/dL (ref 1.9–3.7)
Glucose, Bld: 139 mg/dL — ABNORMAL HIGH (ref 65–99)
Potassium: 4.1 mmol/L (ref 3.5–5.3)
Sodium: 140 mmol/L (ref 135–146)
Total Bilirubin: 1.1 mg/dL (ref 0.2–1.2)
Total Protein: 7.1 g/dL (ref 6.1–8.1)
eGFR: 105 mL/min/1.73m2

## 2024-07-15 LAB — MAGNESIUM: Magnesium: 2 mg/dL (ref 1.5–2.5)

## 2024-07-15 LAB — HEPATITIS C ANTIBODY: Hepatitis C Ab: NONREACTIVE

## 2024-07-15 LAB — HEMOGLOBIN A1C
Hgb A1c MFr Bld: 5.2 %
Mean Plasma Glucose: 103 mg/dL
eAG (mmol/L): 5.7 mmol/L

## 2024-07-15 LAB — HEPATITIS B SURFACE ANTIGEN: Hepatitis B Surface Ag: NONREACTIVE

## 2024-07-15 LAB — PHOSPHORUS: Phosphorus: 2.4 mg/dL — ABNORMAL LOW (ref 2.5–4.5)

## 2024-07-15 LAB — PROTIME-INR
INR: 1.1
Prothrombin Time: 11.4 s (ref 9.0–11.5)

## 2024-07-15 LAB — HIV-1 RNA QUANT-NO REFLEX-BLD
HIV 1 RNA Quant: NOT DETECTED {copies}/mL
HIV-1 RNA Quant, Log: NOT DETECTED {Log_copies}/mL

## 2024-07-15 LAB — TSH: TSH: 1.82 m[IU]/L (ref 0.40–4.50)

## 2024-08-12 ENCOUNTER — Ambulatory Visit: Payer: Self-pay | Admitting: Infectious Disease
